# Patient Record
Sex: Female | Born: 1985 | Race: Black or African American | Hispanic: No | Marital: Single | Smoking: Current some day smoker
Health system: Southern US, Community
[De-identification: ages and names within clinical notes are randomized; demographics above are authoritative.]

## PROBLEM LIST (undated history)

## (undated) DIAGNOSIS — O00102 Left tubal pregnancy without intrauterine pregnancy: Secondary | ICD-10-CM

## (undated) DIAGNOSIS — K661 Hemoperitoneum: Secondary | ICD-10-CM

---
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
## 2010-01-10 ENCOUNTER — Emergency Department (HOSPITAL_BASED_OUTPATIENT_CLINIC_OR_DEPARTMENT_OTHER)
Admission: EM | Admit: 2010-01-10 | Discharge: 2010-01-10 | Payer: Self-pay | Source: Home / Self Care | Admitting: Emergency Medicine

## 2010-01-24 ENCOUNTER — Inpatient Hospital Stay (HOSPITAL_COMMUNITY)
Admission: AD | Admit: 2010-01-24 | Discharge: 2010-01-24 | Payer: Self-pay | Source: Home / Self Care | Attending: Obstetrics and Gynecology | Admitting: Obstetrics and Gynecology

## 2010-02-15 ENCOUNTER — Ambulatory Visit: Admit: 2010-02-15 | Payer: Self-pay | Admitting: Obstetrics & Gynecology

## 2010-02-15 ENCOUNTER — Encounter: Payer: Self-pay | Admitting: Obstetrics and Gynecology

## 2010-02-15 ENCOUNTER — Other Ambulatory Visit: Payer: Self-pay | Admitting: Obstetrics and Gynecology

## 2010-02-15 DIAGNOSIS — N839 Noninflammatory disorder of ovary, fallopian tube and broad ligament, unspecified: Secondary | ICD-10-CM

## 2010-02-15 DIAGNOSIS — N83209 Unspecified ovarian cyst, unspecified side: Secondary | ICD-10-CM

## 2010-02-15 LAB — POCT PREGNANCY, URINE: Preg Test, Ur: NEGATIVE

## 2010-02-20 ENCOUNTER — Ambulatory Visit (HOSPITAL_COMMUNITY)
Admission: RE | Admit: 2010-02-20 | Discharge: 2010-02-20 | Disposition: A | Discharge status: Home / Self Care | Payer: Medicaid Other | Source: Ambulatory Visit | Attending: Obstetrics and Gynecology | Admitting: Obstetrics and Gynecology

## 2010-02-20 DIAGNOSIS — N83209 Unspecified ovarian cyst, unspecified side: Secondary | ICD-10-CM

## 2010-03-05 ENCOUNTER — Ambulatory Visit: Payer: Medicaid Other | Admitting: Obstetrics and Gynecology

## 2010-03-05 DIAGNOSIS — N83209 Unspecified ovarian cyst, unspecified side: Secondary | ICD-10-CM

## 2010-04-06 NOTE — Progress Notes (Signed)
Ebony Haney, Ebony Haney                ACCOUNT NO.:  [id number]  MEDICAL RECORD NO.:  [id number]           PATIENT TYPE:  A  LOCATION:  [facility]                   FACILITY:  [facility]  PHYSICIAN:  Catalina Antigua, MD     DATE OF BIRTH:  Jul 08, 1985  DATE OF SERVICE:  02/15/2010                                 CLINIC NOTE  This is a 25 year old, G2, P2, with LMP of December 18, 2009, who presents as an MAU followup for evaluation of right ovarian cyst.  The patient reports a longstanding history of abnormal Pap smear for which she is unclear how abnormal it was, but never really had followup for and for that purpose, she presented at [facility] in mid December for further evaluation.  At [facility], an ultrasound was performed which demonstrated the presence of a complex right adnexal mass which was suggestive for either hemorrhagic cyst, endometrioma or atypical neoplasm.  The patient never seek OB/GYN followup for and presents today for further evaluation.  The patient is currently without any complaint, denies any abnormal bleeding or pelvic pain.  The patient states that she normally gets normal periods, however, did not get a period in January.  She is not currently using any forms of birth control, states that she was previously on Depo-Provera for the past 7 years which was discontinued approximately 2 years ago and she is uncertain why.  PAST MEDICAL HISTORY:  She denies.  PAST SURGICAL HISTORY:  Denies.  OBSTETRICAL HISTORY:  She had 2 full-term vaginal deliveries.  PAST GYNECOLOGIC HISTORY:  She denies any fibroids.  She reports the presence of this ovarian cyst for the past year or so and she reports history of abnormal Pap smear with no followup.  SOCIAL HISTORY:  She denies drinking or the use of illicit drug.  She is a current smoker of 3-4 cigarettes a day for the past 4 years.  FAMILY HISTORY:  Significant for coronary artery disease, hypertension and colon  cancer.  REVIEW OF SYSTEMS:  Otherwise within normal limits.  ALLERGIES:  She denies any allergies to medications or latex.  PHYSICAL EXAMINATION:  VITAL SIGNS:  Her blood pressure is 117/69, pulse of 68, weight of 72.3 kg. ABDOMEN:  Soft, nontender, nondistended. PELVIC:  She had small anteverted uterus.  No palpable adnexal masses or tenderness.  ASSESSMENT AND PLAN:  This is a 25 year old, gravida 2, para 2 who presents today for further evaluation of the right ovarian cyst that was covered at [facility].  Pelvic ultrasound was ordered today. The patient is to follow up in 1 or 2 weeks for discussion of the results and further management.  The patient was informed that if indeed this ovarian mass has been persistent since December that perhaps surgical intervention needs to be undertaken for her history of longstanding abnormal Pap smear with unclear followup.  Records will be obtained from her [facility] GYN office and this will be addressed also at her next visit.  A urine pregnancy test was ordered today, since she has not had a period in January.          ______________________________  Catalina Antigua, MD    PC/MEDQ  D:  02/15/2010  T:  02/16/2010  Job:  [id number]

## 2010-11-07 ENCOUNTER — Emergency Department (INDEPENDENT_AMBULATORY_CARE_PROVIDER_SITE_OTHER): Payer: Medicaid Other

## 2010-11-07 ENCOUNTER — Encounter: Payer: Self-pay | Admitting: *Deleted

## 2010-11-07 ENCOUNTER — Emergency Department (HOSPITAL_BASED_OUTPATIENT_CLINIC_OR_DEPARTMENT_OTHER)
Admission: EM | Admit: 2010-11-07 | Discharge: 2010-11-07 | Disposition: A | Discharge status: Home / Self Care | Payer: Medicaid Other | Attending: Emergency Medicine | Admitting: Emergency Medicine

## 2010-11-07 DIAGNOSIS — R109 Unspecified abdominal pain: Secondary | ICD-10-CM

## 2010-11-07 DIAGNOSIS — N760 Acute vaginitis: Secondary | ICD-10-CM | POA: Insufficient documentation

## 2010-11-07 DIAGNOSIS — O2390 Unspecified genitourinary tract infection in pregnancy, unspecified trimester: Secondary | ICD-10-CM | POA: Insufficient documentation

## 2010-11-07 DIAGNOSIS — A499 Bacterial infection, unspecified: Secondary | ICD-10-CM | POA: Insufficient documentation

## 2010-11-07 DIAGNOSIS — B9689 Other specified bacterial agents as the cause of diseases classified elsewhere: Secondary | ICD-10-CM | POA: Insufficient documentation

## 2010-11-07 DIAGNOSIS — F17200 Nicotine dependence, unspecified, uncomplicated: Secondary | ICD-10-CM | POA: Insufficient documentation

## 2010-11-07 DIAGNOSIS — Z3490 Encounter for supervision of normal pregnancy, unspecified, unspecified trimester: Secondary | ICD-10-CM

## 2010-11-07 LAB — BASIC METABOLIC PANEL
CO2: 27 mEq/L (ref 19–32)
Calcium: 9.4 mg/dL (ref 8.4–10.5)
Potassium: 3.5 mEq/L (ref 3.5–5.1)
Sodium: 136 mEq/L (ref 135–145)

## 2010-11-07 LAB — WET PREP, GENITAL

## 2010-11-07 LAB — DIFFERENTIAL
Basophils Absolute: 0 10*3/uL (ref 0.0–0.1)
Eosinophils Relative: 1 % (ref 0–5)
Lymphocytes Relative: 31 % (ref 12–46)
Neutro Abs: 3.9 10*3/uL (ref 1.7–7.7)
Neutrophils Relative %: 60 % (ref 43–77)

## 2010-11-07 LAB — URINE MICROSCOPIC-ADD ON

## 2010-11-07 LAB — URINALYSIS, ROUTINE W REFLEX MICROSCOPIC
Glucose, UA: NEGATIVE mg/dL
Protein, ur: NEGATIVE mg/dL
Urobilinogen, UA: 0.2 mg/dL (ref 0.0–1.0)

## 2010-11-07 LAB — CBC
MCV: 86.6 fL (ref 78.0–100.0)
Platelets: 202 10*3/uL (ref 150–400)
RDW: 12.7 % (ref 11.5–15.5)
WBC: 6.5 10*3/uL (ref 4.0–10.5)

## 2010-11-07 LAB — HCG, QUANTITATIVE, PREGNANCY: hCG, Beta Chain, Quant, S: 24676 m[IU]/mL — ABNORMAL HIGH (ref <5)

## 2010-11-07 MED ORDER — METRONIDAZOLE 500 MG PO TABS: 500.0000 mg | ORAL_TABLET | Freq: Two times a day (BID) | ORAL | Status: AC

## 2010-11-07 NOTE — ED Notes (Signed)
Patient states she developed pain in her lower abdomin yesterday and now pain in the upper middle abdomin.  Patient states she is approximately [redacted] weeks pregnant, gravida 3 para 2.

## 2010-11-07 NOTE — ED Provider Notes (Signed)
History     CSN: [id number] Arrival date & time: 11/07/2010 12:29 PM   First MD Initiated Contact with Patient 11/07/10 1230      Chief Complaint  Patient presents with  . Abdominal Pain    [redacted] weeks pregnant    (Consider location/radiation/quality/duration/timing/severity/associated sxs/prior treatment) HPI Comments: Patient says that she has developed sharp pains in her lower abdomen starting yesterday. She is pregnant, with her last menstrual period on 07/07/2010. She has had a positive pregnancy test but has not seen the obstetrician yet. She has an appointment with Dr. Alyce Pagan on November 6. This is her third pregnancy. She had normal spontaneous delivery of her other 2 children. There has been no vomiting or diarrhea. She denies dysuria. She denies vaginal discharge. There has been no vaginal bleeding.  Patient is a 25 y.o. female presenting with abdominal pain. The history is provided by the patient. No language interpreter was used.  Abdominal Pain The primary symptoms of the illness include abdominal pain. The primary symptoms of the illness do not include fever, nausea, vomiting, diarrhea, dysuria, vaginal discharge or vaginal bleeding. The current episode started yesterday. The onset of the illness was gradual. The problem has not changed since onset. The patient states that she believes she is currently pregnant. The patient has not had a change in bowel habit.    Past Medical History  Diagnosis Date  . Hypertension in pregnancy     during second pregnancy    History reviewed. No pertinent past surgical history.  No family history on file.  History  Substance Use Topics  . Smoking status: Current Some Day Smoker -- 0.5 packs/day    Types: Cigarettes  . Smokeless tobacco: Not on file  . Alcohol Use: No    OB History    Grav Para Term Preterm Abortions TAB SAB Ect Mult Living   3 2              Review of Systems  Constitutional: Negative.  Negative for  fever.  HENT: Negative.   Eyes: Negative.   Respiratory: Negative.   Gastrointestinal: Positive for abdominal pain and abdominal distention. Negative for nausea, vomiting and diarrhea.  Genitourinary: Negative for dysuria, vaginal bleeding and vaginal discharge.  Musculoskeletal: Negative.   Skin: Negative.   Neurological: Negative.   Psychiatric/Behavioral: Negative.     Allergies  Review of patient's allergies indicates no known allergies.  Home Medications   Current Outpatient Rx  Name Route Sig Dispense Refill  . PRENATAL #2 PO Oral Take by mouth.      . METRONIDAZOLE 500 MG PO TABS Oral Take 1 tablet (500 mg total) by mouth 2 (two) times daily. 14 tablet 0    BP 123/65  Pulse 85  Temp(Src) 98.8 F (37.1 C) (Oral)  Resp 20  Ht 5\' 7"  (1.702 m)  Wt 154 lb (69.854 kg)  BMI 24.12 kg/m2  SpO2 100%  LMP 08/07/2010  Physical Exam  Constitutional: She is oriented to person, place, and time. She appears well-developed and well-nourished. No distress.  HENT:  Head: Normocephalic and atraumatic.  Right Ear: External ear normal.  Left Ear: External ear normal.  Mouth/Throat: Oropharynx is clear and moist.       She has a tongue piercing.  Eyes: Conjunctivae and EOM are normal. Pupils are equal, round, and reactive to light.  Neck: Normal range of motion. Neck supple. No thyromegaly present.  Cardiovascular: Normal rate, regular rhythm and normal heart sounds.   Pulmonary/Chest: Effort  normal and breath sounds normal.  Abdominal: Soft. Bowel sounds are normal. She exhibits no distension. There is no tenderness.  Musculoskeletal: Normal range of motion. She exhibits no edema.  Lymphadenopathy:    She has no cervical adenopathy.  Neurological: She is alert and oriented to person, place, and time. She has normal reflexes.       No sensory or motor deficits.  Skin: Skin is warm and dry.  Psychiatric: She has a normal mood and affect. Her behavior is normal.    ED Course    Procedures (including critical care time)  Labs Reviewed  URINALYSIS, ROUTINE W REFLEX MICROSCOPIC - Abnormal; Notable for the following:    Appearance CLOUDY (*)    Leukocytes, UA TRACE (*)    All other components within normal limits  HCG, QUANTITATIVE, PREGNANCY - Abnormal; Notable for the following:    hCG, Beta Chain, Quant, [place] [address] (*)    All other components within normal limits  WET PREP, GENITAL - Abnormal; Notable for the following:    Clue Cells, Wet Prep TOO NUMEROUS TO COUNT (*)    WBC, Wet Prep HPF POC FEW (*)    All other components within normal limits  URINE MICROSCOPIC-ADD ON - Abnormal; Notable for the following:    Squamous Epithelial / LPF FEW (*)    Bacteria, UA MANY (*)    All other components within normal limits  CBC  DIFFERENTIAL  BASIC METABOLIC PANEL  GC/CHLAMYDIA PROBE AMP, GENITAL   3:03 PM Pt was seen and had physical examination. Lab tests and pelvic ultrasound were ordered.  3:03 PM Results for orders placed during the hospital encounter of 11/07/10  CBC      Component Value Range   WBC 6.5  4.0 - 10.5 (K/uL)   RBC 4.18  3.87 - 5.11 (MIL/uL)   Hemoglobin 12.0  12.0 - 15.0 (g/dL)   HCT 60.4  54.0 - 98.1 (%)   MCV 86.6  78.0 - 100.0 (fL)   MCH 28.7  26.0 - 34.0 (pg)   MCHC 33.1  30.0 - 36.0 (g/dL)   RDW 19.1  47.8 - 29.5 (%)   Platelets 202  150 - 400 (K/uL)  DIFFERENTIAL      Component Value Range   Neutrophils Relative 60  43 - 77 (%)   Neutro Abs 3.9  1.7 - 7.7 (K/uL)   Lymphocytes Relative 31  12 - 46 (%)   Lymphs Abs 2.0  0.7 - 4.0 (K/uL)   Monocytes Relative 9  3 - 12 (%)   Monocytes Absolute 0.6  0.1 - 1.0 (K/uL)   Eosinophils Relative 1  0 - 5 (%)   Eosinophils Absolute 0.0  0.0 - 0.7 (K/uL)   Basophils Relative 0  0 - 1 (%)   Basophils Absolute 0.0  0.0 - 0.1 (K/uL)  BASIC METABOLIC PANEL      Component Value Range   Sodium 136  135 - 145 (mEq/L)   Potassium 3.5  3.5 - 5.1 (mEq/L)   Chloride 101  96 - 112 (mEq/L)   CO2  27  19 - 32 (mEq/L)   Glucose, Bld 94  70 - 99 (mg/dL)   BUN 8  6 - 23 (mg/dL)   Creatinine, Ser 6.21  0.50 - 1.10 (mg/dL)   Calcium 9.4  8.4 - 30.8 (mg/dL)   GFR calc non Af Amer >90  >90 (mL/min)   GFR calc Af Amer >90  >90 (mL/min)  URINALYSIS, ROUTINE W REFLEX  MICROSCOPIC      Component Value Range   Color, Urine YELLOW  YELLOW    Appearance CLOUDY (*) CLEAR    Specific Gravity, Urine 1.024  1.005 - 1.030    pH 8.0  5.0 - 8.0    Glucose, UA NEGATIVE  NEGATIVE (mg/dL)   Hgb urine dipstick NEGATIVE  NEGATIVE    Bilirubin Urine NEGATIVE  NEGATIVE    Ketones, ur NEGATIVE  NEGATIVE (mg/dL)   Protein, ur NEGATIVE  NEGATIVE (mg/dL)   Urobilinogen, UA 0.2  0.0 - 1.0 (mg/dL)   Nitrite NEGATIVE  NEGATIVE    Leukocytes, UA TRACE (*) NEGATIVE   HCG, QUANTITATIVE, PREGNANCY      Component Value Range   hCG, Beta Chain, Quant, [place] [address] (*) <5 (mIU/mL)  WET PREP, GENITAL      Component Value Range   Yeast, Wet Prep NONE SEEN  NONE SEEN    Trich, Wet Prep NONE SEEN  NONE SEEN    Clue Cells, Wet Prep TOO NUMEROUS TO COUNT (*) NONE SEEN    WBC, Wet Prep HPF POC FEW (*) NONE SEEN   URINE MICROSCOPIC-ADD ON      Component Value Range   Squamous Epithelial / LPF FEW (*) RARE    WBC, UA 3-6  <3 (WBC/hpf)   RBC / HPF 0-2  <3 (RBC/hpf)   Bacteria, UA MANY (*) RARE    US Ob Comp Less 14 Wks  11/07/2010  *RADIOLOGY REPORT*  Clinical Data: Lambert Mody lower abdominal pain.  Pregnant.  OBSTETRIC <14 WK ULTRASOUND  Technique:  Transabdominal ultrasound was performed for evaluation of the gestation as well as the maternal uterus and adnexal regions.  Comparison:  Cold ultrasound 02/20/2010.  Intrauterine gestational sac: Single.  Normal in shape. Yolk sac: No longer visible. Embryo: Present. Cardiac Activity: Present. Heart Rate: 155 bpm  CRL:  65.1 mm  12w  6d        [place] EDC: 05/16/2011  Maternal uterus/Adnexae: Uterus and adnexa are normal.  IMPRESSION: Single intrauterine pregnancy with an estimated  gestational age of [redacted] weeks 6 days.  No complicating features are evident.  Original Report Authenticated By: Jamesetta Orleans. MATTERN, M.D.   Lab tests show bacterial vaginosis.  Ultrasound shows an intrauterine pregnancy 12 weeks and 6 days.  Rx with metronidazole 500 mg bid x 7 days.    1. Normal intrauterine pregnancy on prenatal ultrasound   2. Bacterial vaginosis              Carleene Cooper III, MD 11/07/10 [phone]

## 2010-12-27 ENCOUNTER — Encounter (HOSPITAL_BASED_OUTPATIENT_CLINIC_OR_DEPARTMENT_OTHER): Payer: Self-pay

## 2010-12-27 ENCOUNTER — Emergency Department (HOSPITAL_BASED_OUTPATIENT_CLINIC_OR_DEPARTMENT_OTHER)
Admission: EM | Admit: 2010-12-27 | Discharge: 2010-12-27 | Disposition: A | Discharge status: Home / Self Care | Payer: Medicaid Other | Attending: Emergency Medicine | Admitting: Emergency Medicine

## 2010-12-27 DIAGNOSIS — A499 Bacterial infection, unspecified: Secondary | ICD-10-CM | POA: Insufficient documentation

## 2010-12-27 DIAGNOSIS — N760 Acute vaginitis: Secondary | ICD-10-CM | POA: Insufficient documentation

## 2010-12-27 DIAGNOSIS — Z331 Pregnant state, incidental: Secondary | ICD-10-CM

## 2010-12-27 DIAGNOSIS — R109 Unspecified abdominal pain: Secondary | ICD-10-CM | POA: Insufficient documentation

## 2010-12-27 DIAGNOSIS — F17200 Nicotine dependence, unspecified, uncomplicated: Secondary | ICD-10-CM | POA: Insufficient documentation

## 2010-12-27 DIAGNOSIS — O2390 Unspecified genitourinary tract infection in pregnancy, unspecified trimester: Secondary | ICD-10-CM | POA: Insufficient documentation

## 2010-12-27 DIAGNOSIS — B9689 Other specified bacterial agents as the cause of diseases classified elsewhere: Secondary | ICD-10-CM | POA: Insufficient documentation

## 2010-12-27 LAB — URINALYSIS, ROUTINE W REFLEX MICROSCOPIC
Ketones, ur: NEGATIVE mg/dL
Leukocytes, UA: NEGATIVE
Nitrite: NEGATIVE
Protein, ur: NEGATIVE mg/dL

## 2010-12-27 MED ORDER — METRONIDAZOLE 500 MG PO TABS: 500.0000 mg | ORAL_TABLET | Freq: Two times a day (BID) | ORAL | Status: AC

## 2010-12-27 NOTE — ED Notes (Signed)
Pelvic cart ready. 

## 2010-12-27 NOTE — ED Notes (Signed)
Pt reports suprapubic pain described as pressure that started 4 hours PTA.

## 2010-12-27 NOTE — ED Notes (Signed)
Pt reports she was seen at [facility] yesterday and had a normal ultrasound.

## 2010-12-27 NOTE — ED Provider Notes (Signed)
History     CSN: [id number] Arrival date & time: 12/27/2010 10:28 AM   First MD Initiated Contact with Patient 12/27/10 1057      Chief Complaint  Patient presents with  . Abdominal Pain     HPI  G3P2 [redacted] weeks pregnant pw suprapubic pressure. States pressure began 4 hour pta. Only present when sitting. Denies back pain, cramping, abdominal pain, nausea, vomiting. Denies fever, chills. Denies hematuria/dysuria/freq/urgency. No rash. Denies vaginal bleeding, gush of fluid. Denies itching, vaginal dc. No h/o STI.   ED Notes, ED Provider Notes from 12/27/10 0000 to 12/27/10 10:38:12       Joylene John, RN 12/27/2010 10:31      Pt reports suprapubic pain described as pressure that started 4 hours PTA.     History reviewed. No pertinent past medical history.  History reviewed. No pertinent past surgical history.  No family history on file.  History  Substance Use Topics  . Smoking status: Current Some Day Smoker -- 0.5 packs/day    Types: Cigarettes  . Smokeless tobacco: Not on file  . Alcohol Use: No    OB History    Grav Para Term Preterm Abortions TAB SAB Ect Mult Living   4 2              Review of Systems  All other systems reviewed and are negative.   except as noted HPI   Allergies  Review of patient's allergies indicates no known allergies.  Home Medications   Current Outpatient Rx  Name Route Sig Dispense Refill  . AMOXICILLIN 500 MG PO CAPS Oral Take 500 mg by mouth 3 (three) times daily.      Marland Kitchen METRONIDAZOLE 500 MG PO TABS Oral Take 1 tablet (500 mg total) by mouth 2 (two) times daily. 14 tablet 0  . PRENATAL #2 PO Oral Take by mouth.        BP 123/60  Pulse 81  Temp(Src) 98.2 F (36.8 C) (Oral)  Resp 16  Ht 5\' 6"  (1.676 m)  Wt 170 lb (77.111 kg)  BMI 27.44 kg/m2  SpO2 99%  LMP 08/07/2010  Physical Exam  Nursing note and vitals reviewed. Constitutional: She is oriented to person, place, and time. She appears well-developed.  HENT:   Head: Atraumatic.  Mouth/Throat: Oropharynx is clear and moist.  Eyes: Conjunctivae and EOM are normal. Pupils are equal, round, and reactive to light.  Neck: Normal range of motion. Neck supple.  Cardiovascular: Normal rate, regular rhythm, normal heart sounds and intact distal pulses.   Pulmonary/Chest: Effort normal and breath sounds normal. No respiratory distress. She has no wheezes. She has no rales.  Abdominal: Soft. She exhibits no distension. There is no tenderness. There is no rebound and no guarding.       Gravid uterus  Genitourinary:       Min white discharge No cmt Os closed No blood in vault  Musculoskeletal: Normal range of motion.  Neurological: She is alert and oriented to person, place, and time.  Skin: Skin is warm and dry. No rash noted.  Psychiatric: She has a normal mood and affect.    ED Course  Procedures (including critical care time)  Labs Reviewed  WET PREP, GENITAL - Abnormal; Notable for the following:    Yeast, Wet Prep FEW (*)    Clue Cells, Wet Prep MODERATE (*)    WBC, Wet Prep HPF POC MODERATE (*)    All other components within normal limits  URINALYSIS,  ROUTINE W REFLEX MICROSCOPIC  GC/CHLAMYDIA PROBE AMP, GENITAL   No results found.   1. Pregnancy as incidental finding   2. Bacterial vaginosis     MDM  Appears well. FHR 132. Pressure "positional" and none present at this time. Min discharge on exam but no itching. Gave pt prescription for flagyl for worsening discharge, pruritis. F/U with her OB. Comfortable with plan for discharge        Forbes Cellar, MD 12/27/10 1245

## 2012-06-12 IMAGING — US US OB COMP LESS 14 WK
1 series · 14 of 28 positions shown · non-contrast
Comparison: Cold ultrasound 02/20/2010.

CLINICAL DATA: Sharp lower abdominal pain.  Pregnant.

OBSTETRIC <14 WK ULTRASOUND
TECHNIQUE: Transabdominal ultrasound was performed for evaluation
of the gestation as well as the maternal uterus and adnexal
regions.

[Series 1: us ob comp less 14 wk · 0.26mm/px · 14 of 34 slices shown]
[im 2/34]
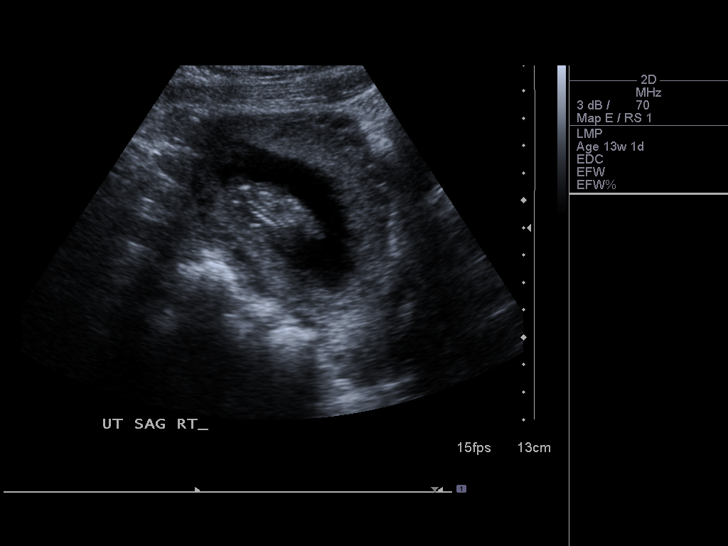
[im 4/34]
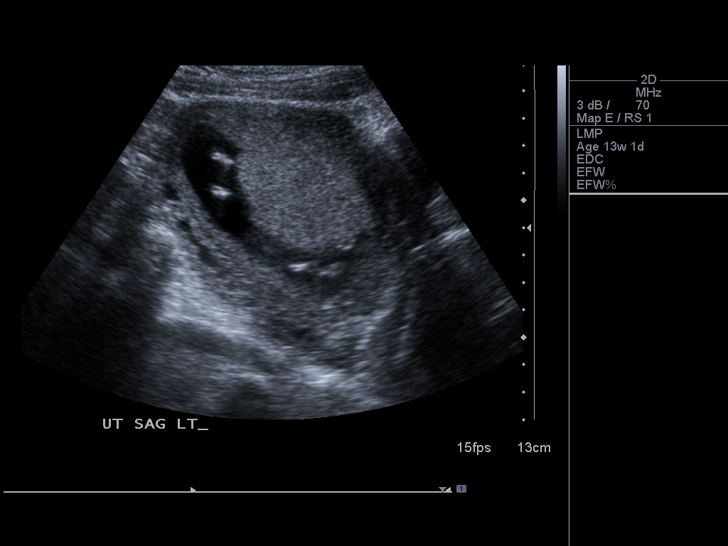
[im 7/34]
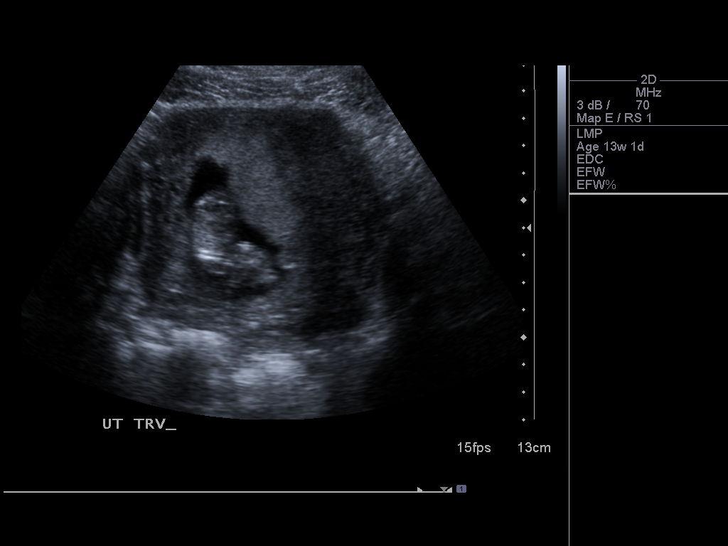
[im 9/34]
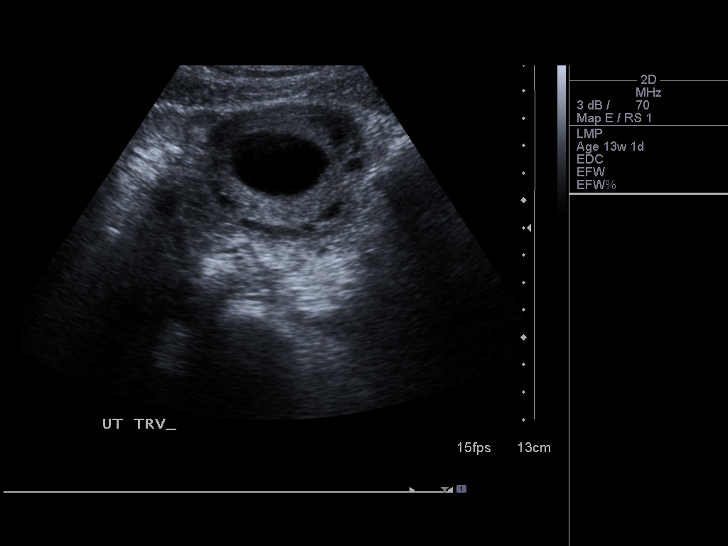
[im 12/34]
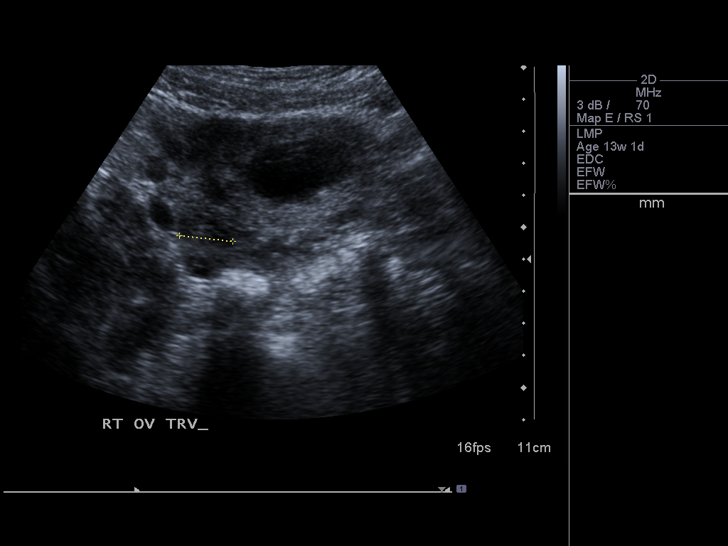
[im 14/34]
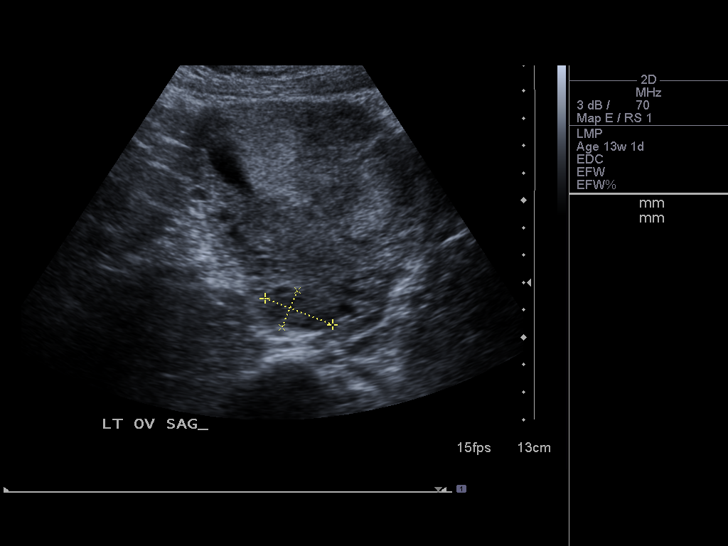
[im 16/34]
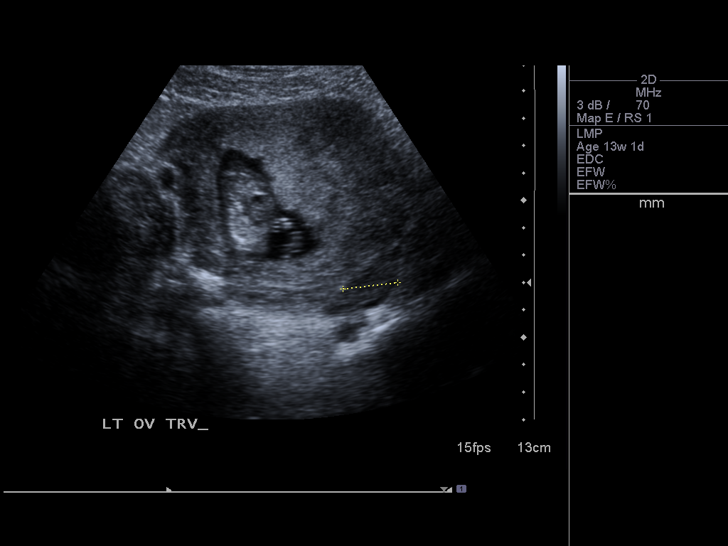
[im 19/34]
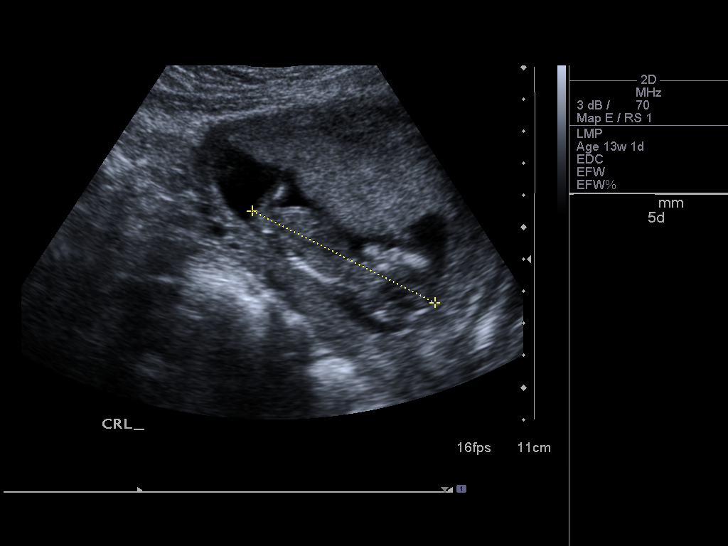
[im 21/34]
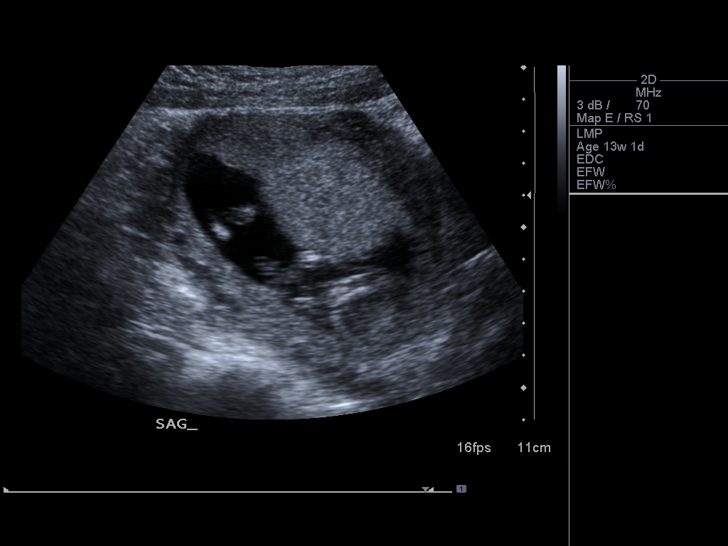
[im 24/34]
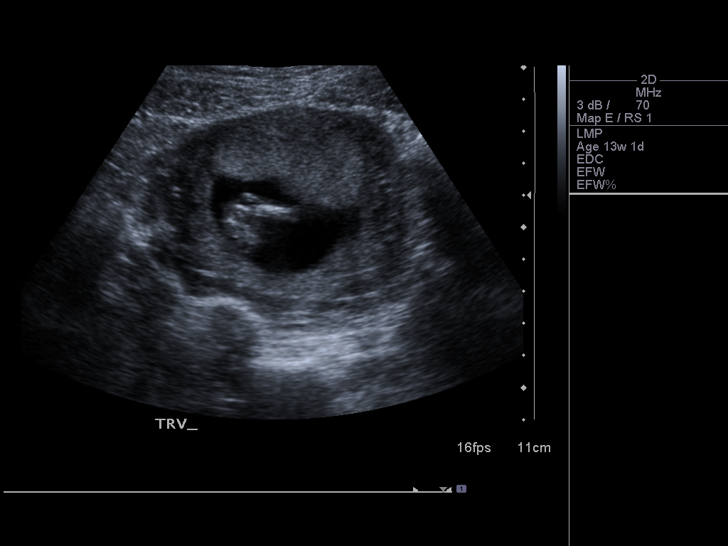
[im 26/34]
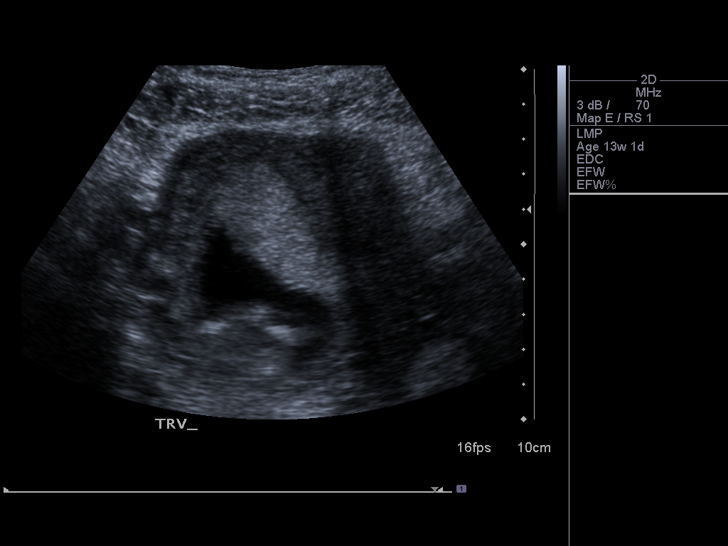
[im 29/34]
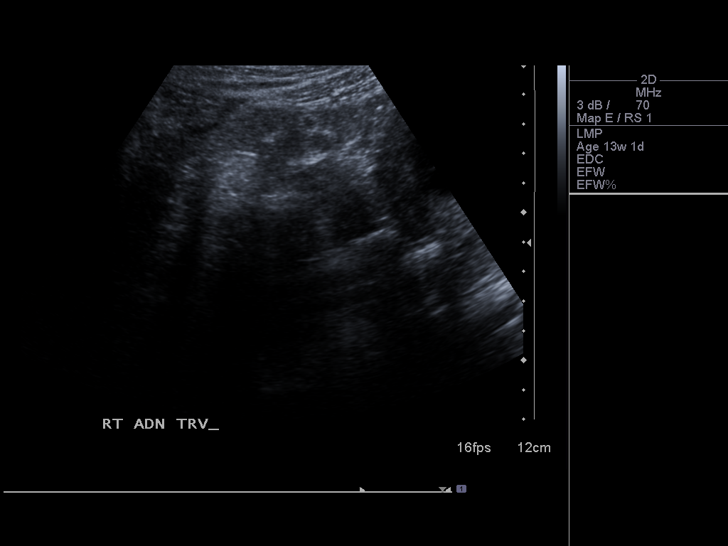
[im 31/34]
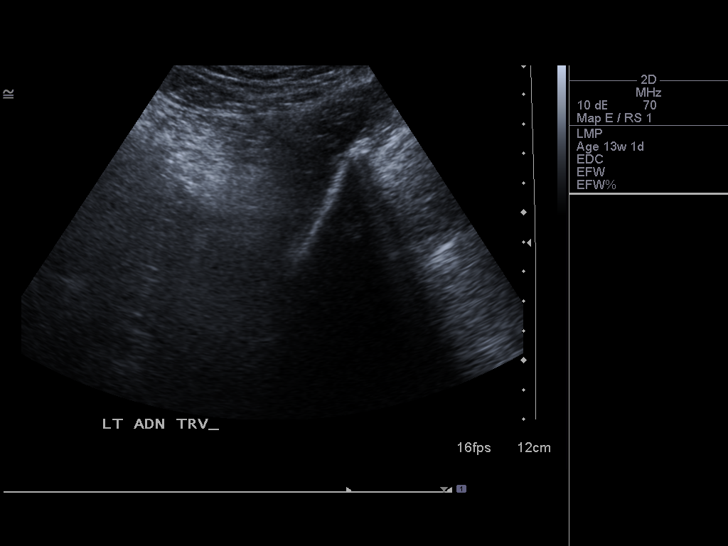
[im 34/34]
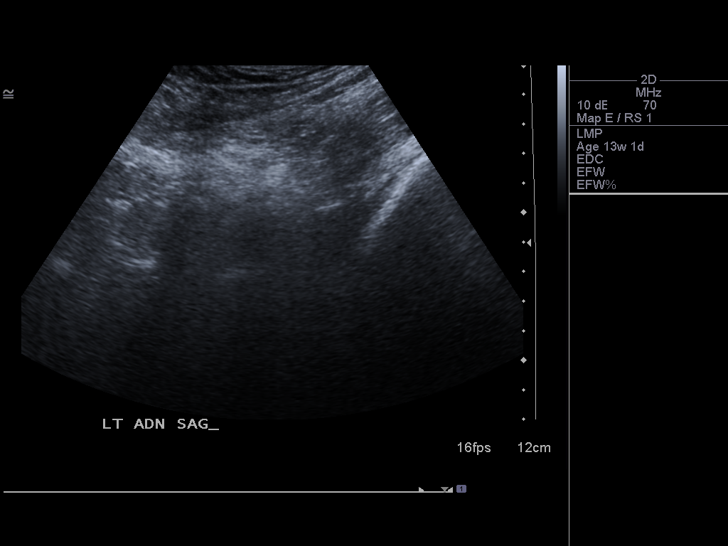

[14 of 28 positions shown; findings below may reference images not displayed]

Intrauterine gestational sac: Single.  Normal in shape.
Yolk sac: No longer visible.
Embryo: Present.
Cardiac Activity: Present.
Heart Rate: 155 bpm

CRL:  65.1 mm  12w  6d        US EDC: 05/16/2011

Maternal uterus/Adnexae:
Uterus and adnexa are normal.
IMPRESSION: Single intrauterine pregnancy with an estimated gestational age of
12 weeks 6 days.  No complicating features are evident.

## 2012-11-14 ENCOUNTER — Emergency Department (HOSPITAL_BASED_OUTPATIENT_CLINIC_OR_DEPARTMENT_OTHER)
Admission: EM | Admit: 2012-11-14 | Discharge: 2012-11-14 | Disposition: A | Discharge status: Home / Self Care | Payer: Medicaid Other | Attending: Emergency Medicine | Admitting: Emergency Medicine

## 2012-11-14 ENCOUNTER — Encounter (HOSPITAL_BASED_OUTPATIENT_CLINIC_OR_DEPARTMENT_OTHER): Payer: Self-pay | Admitting: Emergency Medicine

## 2012-11-14 DIAGNOSIS — J029 Acute pharyngitis, unspecified: Secondary | ICD-10-CM | POA: Insufficient documentation

## 2012-11-14 DIAGNOSIS — R0609 Other forms of dyspnea: Secondary | ICD-10-CM | POA: Insufficient documentation

## 2012-11-14 DIAGNOSIS — O2390 Unspecified genitourinary tract infection in pregnancy, unspecified trimester: Secondary | ICD-10-CM | POA: Insufficient documentation

## 2012-11-14 DIAGNOSIS — R0989 Other specified symptoms and signs involving the circulatory and respiratory systems: Secondary | ICD-10-CM | POA: Insufficient documentation

## 2012-11-14 DIAGNOSIS — F17200 Nicotine dependence, unspecified, uncomplicated: Secondary | ICD-10-CM | POA: Insufficient documentation

## 2012-11-14 DIAGNOSIS — N390 Urinary tract infection, site not specified: Secondary | ICD-10-CM | POA: Insufficient documentation

## 2012-11-14 DIAGNOSIS — Z3490 Encounter for supervision of normal pregnancy, unspecified, unspecified trimester: Secondary | ICD-10-CM

## 2012-11-14 LAB — URINALYSIS, ROUTINE W REFLEX MICROSCOPIC
Glucose, UA: NEGATIVE mg/dL
Protein, ur: NEGATIVE mg/dL
pH: 7 (ref 5.0–8.0)

## 2012-11-14 LAB — WET PREP, GENITAL: Clue Cells Wet Prep HPF POC: NONE SEEN

## 2012-11-14 LAB — PREGNANCY, URINE: Preg Test, Ur: POSITIVE — AB

## 2012-11-14 LAB — URINE MICROSCOPIC-ADD ON

## 2012-11-14 MED ORDER — PRENATAL COMPLETE 14-0.4 MG PO TABS: 1.0000 | ORAL_TABLET | Freq: Once | ORAL | Status: DC

## 2012-11-14 MED ORDER — AMOXICILLIN 500 MG PO CAPS: 500.0000 mg | ORAL_CAPSULE | Freq: Three times a day (TID) | ORAL | Status: DC

## 2012-11-14 NOTE — ED Provider Notes (Signed)
CSN: [id number]     Arrival date & time 11/14/12  1954 History   First MD Initiated Contact with Patient 11/14/12 2045     Chief Complaint  Patient presents with  . Sore Throat  . Vaginal Discharge   (Consider location/radiation/quality/duration/timing/severity/associated sxs/prior Treatment) Patient is a 27 y.o. female presenting with pharyngitis and vaginal discharge. The history is provided by the patient. No language interpreter was used.  Sore Throat This is a new problem. The current episode started today. The problem occurs constantly. The problem has been unchanged. Nothing aggravates the symptoms. She has tried nothing for the symptoms. The treatment provided no relief.  Vaginal Discharge Quality:  Watery Severity:  Mild Onset quality:  Sudden Timing:  Constant Progression:  Worsening Ineffective treatments:  None tried   History reviewed. No pertinent past medical history. History reviewed. No pertinent past surgical history. History reviewed. No pertinent family history. History  Substance Use Topics  . Smoking status: Current Some Day Smoker -- 0.50 packs/day    Types: Cigarettes  . Smokeless tobacco: Not on file  . Alcohol Use: No   OB History   Grav Para Term Preterm Abortions TAB SAB Ect Mult Living   4 2             Review of Systems  Genitourinary: Positive for vaginal discharge.  All other systems reviewed and are negative.    Allergies  Review of patient's allergies indicates no known allergies.  Home Medications   Current Outpatient Rx  Name  Route  Sig  Dispense  Refill  . amoxicillin (AMOXIL) 500 MG capsule   Oral   Take 500 mg by mouth 3 (three) times daily.           . Prenatal Multivit-Min-Fe-FA (PRENATAL #2 PO)   Oral   Take by mouth.            BP 143/76  Pulse 78  Temp(Src) 98.3 F (36.8 C) (Oral)  Resp 18  Ht 5\' 7"  (1.702 m)  Wt 170 lb (77.111 kg)  BMI 26.62 kg/m2  SpO2 100%  LMP 07/01/2012  Breastfeeding?  Unknown Physical Exam  Nursing note and vitals reviewed. Constitutional: She is oriented to person, place, and time. She appears well-developed and well-nourished.  HENT:  Head: Normocephalic.  Right Ear: External ear normal.  Left Ear: External ear normal.  Mouth/Throat: Oropharynx is clear and moist.  Eyes: Conjunctivae and EOM are normal. Pupils are equal, round, and reactive to light.  Neck: Normal range of motion.  Cardiovascular: Normal rate and regular rhythm.   Pulmonary/Chest: She is in respiratory distress.  Abdominal: Soft. She exhibits no distension.  Genitourinary: Vagina normal and uterus normal.  Musculoskeletal: Normal range of motion.  Neurological: She is alert and oriented to person, place, and time.  Skin: Skin is warm.  Psychiatric: She has a normal mood and affect.    ED Course  Procedures (including critical care time) Labs Review Labs Reviewed  WET PREP, GENITAL - Abnormal; Notable for the following:    WBC, Wet Prep HPF POC MANY (*)    All other components within normal limits  URINALYSIS, ROUTINE W REFLEX MICROSCOPIC - Abnormal; Notable for the following:    APPearance CLOUDY (*)    Leukocytes, UA MODERATE (*)    All other components within normal limits  PREGNANCY, URINE - Abnormal; Notable for the following:    Preg Test, Ur POSITIVE (*)    All other components within normal limits  URINE  MICROSCOPIC-ADD ON - Abnormal; Notable for the following:    Squamous Epithelial / LPF MANY (*)    All other components within normal limits  GC/CHLAMYDIA PROBE AMP   Imaging Review No results found.  EKG Interpretation   None       MDM   1. Pregnancy   2. UTI (lower urinary tract infection)    Pregnancy test is positive,   I will treat uti with amoxicillian.   Pt given rx for prenatal vitamins    Elson Areas, PA-C 11/14/12 2203

## 2012-11-14 NOTE — ED Notes (Signed)
Pt c/o sore throat x1 day, states it is hard to swallow - also c/o foul smell from vaginal area x3 days.

## 2012-11-15 NOTE — ED Provider Notes (Signed)
Medical screening examination/treatment/procedure(s) were performed by non-physician practitioner and as supervising physician I was immediately available for consultation/collaboration.  EKG Interpretation   None         Junius Argyle, MD 11/15/12 1045

## 2012-11-16 LAB — GC/CHLAMYDIA PROBE AMP: GC Probe RNA: NEGATIVE

## 2013-11-15 ENCOUNTER — Encounter (HOSPITAL_BASED_OUTPATIENT_CLINIC_OR_DEPARTMENT_OTHER): Payer: Self-pay | Admitting: Emergency Medicine

## 2014-10-19 ENCOUNTER — Emergency Department (HOSPITAL_BASED_OUTPATIENT_CLINIC_OR_DEPARTMENT_OTHER)
Admission: EM | Admit: 2014-10-19 | Discharge: 2014-10-20 | Disposition: A | Discharge status: Home / Self Care | Payer: Medicaid Other | Attending: Emergency Medicine | Admitting: Emergency Medicine

## 2014-10-19 ENCOUNTER — Encounter (HOSPITAL_BASED_OUTPATIENT_CLINIC_OR_DEPARTMENT_OTHER): Payer: Self-pay

## 2014-10-19 DIAGNOSIS — Y92410 Unspecified street and highway as the place of occurrence of the external cause: Secondary | ICD-10-CM | POA: Insufficient documentation

## 2014-10-19 DIAGNOSIS — Z792 Long term (current) use of antibiotics: Secondary | ICD-10-CM | POA: Insufficient documentation

## 2014-10-19 DIAGNOSIS — Y9389 Activity, other specified: Secondary | ICD-10-CM | POA: Insufficient documentation

## 2014-10-19 DIAGNOSIS — S39012A Strain of muscle, fascia and tendon of lower back, initial encounter: Secondary | ICD-10-CM | POA: Insufficient documentation

## 2014-10-19 DIAGNOSIS — Z720 Tobacco use: Secondary | ICD-10-CM | POA: Insufficient documentation

## 2014-10-19 DIAGNOSIS — Y998 Other external cause status: Secondary | ICD-10-CM | POA: Insufficient documentation

## 2014-10-19 MED ORDER — CYCLOBENZAPRINE HCL 10 MG PO TABS
5.0000 mg | ORAL_TABLET | Freq: Once | ORAL | Status: AC
  Administered 2014-10-19: 5 mg via ORAL
  Filled 2014-10-19: qty 1

## 2014-10-19 MED ORDER — IBUPROFEN 800 MG PO TABS
800.0000 mg | ORAL_TABLET | Freq: Once | ORAL | Status: AC
  Administered 2014-10-19: 800 mg via ORAL
  Filled 2014-10-19: qty 1
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
## 2014-10-20 MED ORDER — IBUPROFEN 600 MG PO TABS: 600.0000 mg | ORAL_TABLET | Freq: Four times a day (QID) | ORAL | Status: DC | PRN

## 2014-10-20 MED ORDER — CYCLOBENZAPRINE HCL 5 MG PO TABS: 5.0000 mg | ORAL_TABLET | Freq: Three times a day (TID) | ORAL | Status: DC | PRN

## 2014-10-20 NOTE — Discharge Instructions (Signed)

## 2015-03-22 ENCOUNTER — Emergency Department (HOSPITAL_BASED_OUTPATIENT_CLINIC_OR_DEPARTMENT_OTHER): Payer: Self-pay

## 2015-03-22 ENCOUNTER — Encounter (HOSPITAL_BASED_OUTPATIENT_CLINIC_OR_DEPARTMENT_OTHER): Payer: Self-pay | Admitting: Emergency Medicine

## 2015-03-22 ENCOUNTER — Emergency Department (HOSPITAL_BASED_OUTPATIENT_CLINIC_OR_DEPARTMENT_OTHER)
Admission: EM | Admit: 2015-03-22 | Discharge: 2015-03-22 | Disposition: A | Discharge status: Home / Self Care | Payer: Self-pay | Attending: Emergency Medicine | Admitting: Emergency Medicine

## 2015-03-22 DIAGNOSIS — F17210 Nicotine dependence, cigarettes, uncomplicated: Secondary | ICD-10-CM | POA: Insufficient documentation

## 2015-03-22 DIAGNOSIS — Z3490 Encounter for supervision of normal pregnancy, unspecified, unspecified trimester: Secondary | ICD-10-CM

## 2015-03-22 DIAGNOSIS — R1032 Left lower quadrant pain: Secondary | ICD-10-CM

## 2015-03-22 DIAGNOSIS — Z792 Long term (current) use of antibiotics: Secondary | ICD-10-CM | POA: Insufficient documentation

## 2015-03-22 DIAGNOSIS — Z3A00 Weeks of gestation of pregnancy not specified: Secondary | ICD-10-CM | POA: Insufficient documentation

## 2015-03-22 DIAGNOSIS — O99331 Smoking (tobacco) complicating pregnancy, first trimester: Secondary | ICD-10-CM | POA: Insufficient documentation

## 2015-03-22 DIAGNOSIS — O209 Hemorrhage in early pregnancy, unspecified: Secondary | ICD-10-CM | POA: Insufficient documentation

## 2015-03-22 LAB — CBC WITH DIFFERENTIAL/PLATELET
BASOS PCT: 0 %
Basophils Absolute: 0 10*3/uL (ref 0.0–0.1)
EOS ABS: 0 10*3/uL (ref 0.0–0.7)
Eosinophils Relative: 1 %
HEMATOCRIT: 39.9 % (ref 36.0–46.0)
Hemoglobin: 13 g/dL (ref 12.0–15.0)
Lymphocytes Relative: 39 %
Lymphs Abs: 1.5 10*3/uL (ref 0.7–4.0)
MCH: 28.5 pg (ref 26.0–34.0)
MCHC: 32.6 g/dL (ref 30.0–36.0)
MCV: 87.5 fL (ref 78.0–100.0)
MONO ABS: 0.3 10*3/uL (ref 0.1–1.0)
MONOS PCT: 7 %
Neutro Abs: 2 10*3/uL (ref 1.7–7.7)
Neutrophils Relative %: 53 %
Platelets: 235 10*3/uL (ref 150–400)
RBC: 4.56 MIL/uL (ref 3.87–5.11)
RDW: 12.5 % (ref 11.5–15.5)
WBC: 3.8 10*3/uL — ABNORMAL LOW (ref 4.0–10.5)

## 2015-03-22 LAB — BASIC METABOLIC PANEL
Anion gap: 10 (ref 5–15)
BUN: 13 mg/dL (ref 6–20)
CO2: 23 mmol/L (ref 22–32)
CREATININE: 0.75 mg/dL (ref 0.44–1.00)
Calcium: 9.1 mg/dL (ref 8.9–10.3)
Chloride: 104 mmol/L (ref 101–111)
GFR calc non Af Amer: >60 mL/min (ref >60)
Glucose, Bld: 99 mg/dL (ref 65–99)
Potassium: 3.9 mmol/L (ref 3.5–5.1)
Sodium: 137 mmol/L (ref 135–145)

## 2015-03-22 LAB — PREGNANCY, URINE: Preg Test, Ur: POSITIVE — AB

## 2015-03-22 LAB — URINALYSIS, ROUTINE W REFLEX MICROSCOPIC
BILIRUBIN URINE: NEGATIVE
GLUCOSE, UA: NEGATIVE mg/dL
Ketones, ur: 15 mg/dL — AB
Nitrite: NEGATIVE
PROTEIN: NEGATIVE mg/dL
Specific Gravity, Urine: 1.026 (ref 1.005–1.030)
pH: 6.5 (ref 5.0–8.0)

## 2015-03-22 LAB — WET PREP, GENITAL
SPERM: NONE SEEN
TRICH WET PREP: NONE SEEN
YEAST WET PREP: NONE SEEN

## 2015-03-22 LAB — URINE MICROSCOPIC-ADD ON

## 2015-03-22 LAB — HCG, QUANTITATIVE, PREGNANCY: HCG, BETA CHAIN, QUANT, S: 575 m[IU]/mL — AB (ref <5)

## 2015-03-22 LAB — ABO/RH: ABO/RH(D): O POS

## 2015-03-22 MED ORDER — MORPHINE SULFATE (PF) 4 MG/ML IV SOLN
4.0000 mg | Freq: Once | INTRAVENOUS | Status: AC
  Administered 2015-03-22: 4 mg via INTRAVENOUS
  Filled 2015-03-22: qty 1

## 2015-03-22 NOTE — ED Provider Notes (Signed)
CSN: [id number]     Arrival date & time 03/22/15  1003 History   First MD Initiated Contact with Patient 03/22/15 1051     Chief Complaint  Patient presents with  . Abdominal Pain   (Consider location/radiation/quality/duration/timing/severity/associated sxs/prior Treatment) Patient is a 30 y.o. female presenting with abdominal pain. The history is provided by the patient. No language interpreter was used.  Abdominal Pain Associated symptoms: no constipation, no diarrhea, no fever, no nausea and no vomiting     Ebony Haney is a 30 y.o female G5 P4 A0 with no past medical history who presents for left lower quadrant abdominal pain that began yesterday. She describes it as intermittent and sometimes excruciating. She took ibuprofen 800 mg at 7:00 last night. States her last menstrual cycle was March 12, 2015 and lasted 7 days. She also reports 2 episodes of bright red spotting including last night and this morning. Denies being on birth control. She reports having an STD in the past but does not remember which one. She denies any fever, chills, nausea, vomiting, diarrhea, dysuria, hematuria, or urinary frequency.  No past medical history on file. No past surgical history on file. No family history on file. Social History  Substance Use Topics  . Smoking status: Current Some Day Smoker -- 0.50 packs/day    Types: Cigarettes  . Smokeless tobacco: None  . Alcohol Use: No   OB History    Gravida Para Term Preterm AB TAB SAB Ectopic Multiple Living   4 2             Review of Systems  Constitutional: Negative for fever.  Gastrointestinal: Positive for abdominal pain. Negative for nausea, vomiting, diarrhea and constipation.  All other systems reviewed and are negative.     Allergies  Review of patient's allergies indicates no known allergies.  Home Medications   Prior to Admission medications   Medication Sig Start Date End Date Taking? Authorizing Provider  amoxicillin  (AMOXIL) 500 MG capsule Take 500 mg by mouth 3 (three) times daily.      Historical Provider, MD  amoxicillin (AMOXIL) 500 MG capsule Take 1 capsule (500 mg total) by mouth 3 (three) times daily. 11/14/12   Elson Areas, PA-C  cyclobenzaprine (FLEXERIL) 5 MG tablet Take 1 tablet (5 mg total) by mouth 3 (three) times daily as needed for muscle spasms. 10/20/14   Shon Baton, MD  ibuprofen (ADVIL,MOTRIN) 600 MG tablet Take 1 tablet (600 mg total) by mouth every 6 (six) hours as needed. 10/20/14   Shon Baton, MD  Prenatal Multivit-Min-Fe-FA (PRENATAL #2 PO) Take by mouth.      Historical Provider, MD  Prenatal Vit-Fe Fumarate-FA (PRENATAL COMPLETE) 14-0.4 MG TABS Take 1 capsule by mouth once. 11/14/12   Lonia Skinner Sofia, PA-C   BP 126/79 mmHg  Pulse 62  Temp(Src) 98.7 F (37.1 C) (Oral)  Resp 18  Ht  (1.778 m)  Wt 73.483 kg  BMI 23.24 kg/m2  SpO2 100%  LMP 03/12/2015 Physical Exam  Constitutional: She is oriented to person, place, and time. She appears well-developed and well-nourished. No distress.  HENT:  Head: Normocephalic and atraumatic.  Eyes: Conjunctivae are normal.  Neck: Normal range of motion. Neck supple.  Cardiovascular: Normal rate, regular rhythm and normal heart sounds.   Regular rate and rhythm. No murmur.  Pulmonary/Chest: Effort normal and breath sounds normal.  Lungs clear to auscultation bilaterally.  Abdominal: Soft. There is tenderness in the left lower quadrant.  There is no rebound and no guarding.    Left lower quadrant abdominal tenderness to palpation. No guarding or rebound. Normal-appearing abdomen. No distention. No CVA tenderness.  Genitourinary:  Pelvic exam: Chaperone present. Left sided adnexal tenderness. Mild vaginal bleeding from the cervical os but cervical os is closed.  Musculoskeletal: Normal range of motion.  Neurological: She is alert and oriented to person, place, and time.  Skin: Skin is warm and dry.  Nursing note and  vitals reviewed.   ED Course  Procedures (including critical care time) Labs Review Labs Reviewed  WET PREP, GENITAL - Abnormal; Notable for the following:    Clue Cells Wet Prep HPF POC PRESENT (*)    WBC, Wet Prep HPF POC MODERATE (*)    All other components within normal limits  URINALYSIS, ROUTINE W REFLEX MICROSCOPIC (NOT AT [facility]) - Abnormal; Notable for the following:    APPearance TURBID (*)    Hgb urine dipstick LARGE (*)    Ketones, ur 15 (*)    Leukocytes, UA LARGE (*)    All other components within normal limits  PREGNANCY, URINE - Abnormal; Notable for the following:    Preg Test, Ur POSITIVE (*)    All other components within normal limits  CBC WITH DIFFERENTIAL/PLATELET - Abnormal; Notable for the following:    WBC 3.8 (*)    All other components within normal limits  HCG, QUANTITATIVE, PREGNANCY - Abnormal; Notable for the following:    hCG, Beta Chain, Quant, S 575 (*)    All other components within normal limits  URINE MICROSCOPIC-ADD ON - Abnormal; Notable for the following:    Squamous Epithelial / LPF 0-5 (*)    Bacteria, UA MANY (*)    All other components within normal limits  URINE CULTURE  BASIC METABOLIC PANEL  ABO/RH  GC/CHLAMYDIA PROBE AMP () NOT AT [facility]    Imaging Review US Ob Comp Less 14 Wks  03/22/2015  CLINICAL DATA:  Left lower quadrant pain and spotting for 1 day, estimated gestational age of [redacted] week 3 days, quantitative beta HCG not available EXAM: OBSTETRIC <14 WK [place] AND TRANSVAGINAL OB US TECHNIQUE: Both transabdominal and transvaginal ultrasound examinations were performed for complete evaluation of the gestation as well as the maternal uterus, adnexal regions, and pelvic cul-de-sac. Transvaginal technique was performed to assess early pregnancy. COMPARISON:  None. FINDINGS: Intrauterine gestational sac: None visualized Yolk sac:  None Embryo:  None Cardiac Activity: None Maternal uterus/adnexae: The endometrium measures about 3 mm.  There is a small amount of fluid with debris in the endometrial canal. The right ovary is normal. The left ovary demonstrates a 12 x 11 x 13 mm mildly heterogeneous nodular area with peripheral enhancement. There is a small volume of free fluid in the pelvis. IMPRESSION: No evidence of intrauterine gestation currently. No specific evidence of ectopic pregnancy. Possible early corpus luteum left ovary. Nonspecific small volume free fluid in the pelvis. Differential diagnostic possibilities include intrauterine gestation that is too early to image, spontaneous abortion, or nonvisualized ectopic pregnancy. Close clinical and sonographic follow-up recommended. Electronically Signed   By: Esperanza Heir M.D.   On: 03/22/2015 12:29   US Ob Transvaginal  03/22/2015  CLINICAL DATA:  Left lower quadrant pain and spotting for 1 day, estimated gestational age of [redacted] week 3 days, quantitative beta HCG not available EXAM: OBSTETRIC <14 WK [place] AND TRANSVAGINAL OB US TECHNIQUE: Both transabdominal and transvaginal ultrasound examinations were performed for complete evaluation of the  gestation as well as the maternal uterus, adnexal regions, and pelvic cul-de-sac. Transvaginal technique was performed to assess early pregnancy. COMPARISON:  None. FINDINGS: Intrauterine gestational sac: None visualized Yolk sac:  None Embryo:  None Cardiac Activity: None Maternal uterus/adnexae: The endometrium measures about 3 mm. There is a small amount of fluid with debris in the endometrial canal. The right ovary is normal. The left ovary demonstrates a 12 x 11 x 13 mm mildly heterogeneous nodular area with peripheral enhancement. There is a small volume of free fluid in the pelvis. IMPRESSION: No evidence of intrauterine gestation currently. No specific evidence of ectopic pregnancy. Possible early corpus luteum left ovary. Nonspecific small volume free fluid in the pelvis. Differential diagnostic possibilities include intrauterine gestation  that is too early to image, spontaneous abortion, or nonvisualized ectopic pregnancy. Close clinical and sonographic follow-up recommended. Electronically Signed   By: Esperanza Heiraymond  Rubner M.D.   On: 03/22/2015 12:29   I have personally reviewed and evaluated these images and lab results as part of my medical decision-making.   EKG Interpretation None      MDM   Final diagnoses:  LLQ abdominal pain  Pregnant  Vaginal bleeding in pregnancy, first trimester   Patient presents for left lower quadrant abdominal pain that began yesterday. She did not know she was pregnant when she arrived in the ED but results were positive. There was suspicion for ectopic pregnancy. First day of last menstrual period was 03/13/2015. Patient has no urinary symptoms such as dysuria, hematuria, or urinary frequency. She did complain of some vaginal spotting. HCG today is 575. Otherwise, labs are normal. The ultrasound showed no evidence of intrauterine gestation or any specific evidence of ectopic pregnancy. It may be too early in the pregnancy. Patient will need repeat hCG within 48 hours and close follow-up with women's outpatient clinic. I discussed all of this with the patient. I also explained that she may be having a spontaneous abortion. Return precautions were thoroughly discussed. Patient agrees with plan. Filed Vitals:   03/22/15 1313 03/22/15 1326  BP: 119/87 126/79  Pulse: 78 62  Temp:    Resp: 16 18   Medications  morphine 4 MG/ML injection 4 mg (4 mg Intravenous Given 03/22/15 1136)       Catha GosselinHanna Patel-Mills, PA-C 03/22/15 1647  Loren Raceravid Yelverton, MD 03/23/15 1056

## 2015-03-22 NOTE — ED Notes (Signed)
Patient transported to radiology department via stretcher. 

## 2015-03-22 NOTE — ED Notes (Signed)
Pt states she started to lower abdominal pain yesterday with spotting.  Pt recently finished her period.

## 2015-03-22 NOTE — Discharge Instructions (Signed)
Abdominal Pain, Adult Return for increased abdominal pain, nausea, vomiting, worsening vaginal bleeding. Follow-up within 48 hours with women's outpatient clinic for repeat hCG level. Your level today was 545. Many things can cause belly (abdominal) pain. Most times, the belly pain is not dangerous. Many cases of belly pain can be watched and treated at home. HOME CARE   Do not take medicines that help you go poop (laxatives) unless told to by your doctor.  Only take medicine as told by your doctor.  Eat or drink as told by your doctor. Your doctor will tell you if you should be on a special diet. GET HELP IF:  You do not know what is causing your belly pain.  You have belly pain while you are sick to your stomach (nauseous) or have runny poop (diarrhea).  You have pain while you pee or poop.  Your belly pain wakes you up at night.  You have belly pain that gets worse or better when you eat.  You have belly pain that gets worse when you eat fatty foods.  You have a fever. GET HELP RIGHT AWAY IF:   The pain does not go away within 2 hours.  You keep throwing up (vomiting).  The pain changes and is only in the right or left part of the belly.  You have bloody or tarry looking poop. MAKE SURE YOU:   Understand these instructions.  Will watch your condition.  Will get help right away if you are not doing well or get worse.   This information is not intended to replace advice given to you by your health care provider. Make sure you discuss any questions you have with your health care provider.   Document Released: 06/19/2007 Document Revised: 01/21/2014 Document Reviewed: 09/09/2012 Elsevier Interactive Patient Education [employer]2016 [employer]

## 2015-03-23 LAB — GC/CHLAMYDIA PROBE AMP (~~LOC~~) NOT AT ARMC
CHLAMYDIA, DNA PROBE: NEGATIVE
NEISSERIA GONORRHEA: NEGATIVE

## 2015-03-24 LAB — URINE CULTURE
Culture: >=100000
Special Requests: NORMAL

## 2015-03-26 ENCOUNTER — Telehealth: Payer: Self-pay | Admitting: *Deleted

## 2015-03-26 NOTE — ED Notes (Signed)
(+)  urine culture, unable to contact by phone, letter sent.

## 2015-03-27 ENCOUNTER — Encounter: Payer: Self-pay | Admitting: Family Medicine

## 2015-03-27 ENCOUNTER — Other Ambulatory Visit: Payer: Self-pay

## 2015-03-27 DIAGNOSIS — Z3490 Encounter for supervision of normal pregnancy, unspecified, unspecified trimester: Secondary | ICD-10-CM

## 2015-03-27 LAB — HCG, QUANTITATIVE, PREGNANCY: hCG, Beta Chain, Quant, S: 905.8 m[IU]/mL — ABNORMAL HIGH

## 2015-03-28 ENCOUNTER — Inpatient Hospital Stay (HOSPITAL_COMMUNITY): Payer: Self-pay | Admitting: Anesthesiology

## 2015-03-28 ENCOUNTER — Encounter: Payer: Self-pay | Admitting: Obstetrics & Gynecology

## 2015-03-28 ENCOUNTER — Encounter (HOSPITAL_BASED_OUTPATIENT_CLINIC_OR_DEPARTMENT_OTHER): Payer: Self-pay | Admitting: Emergency Medicine

## 2015-03-28 ENCOUNTER — Emergency Department (HOSPITAL_BASED_OUTPATIENT_CLINIC_OR_DEPARTMENT_OTHER)
Admission: EM | Admit: 2015-03-28 | Discharge: 2015-03-28 | Disposition: A | Discharge status: Home / Self Care | Payer: No Typology Code available for payment source | Attending: Emergency Medicine | Admitting: Emergency Medicine

## 2015-03-28 ENCOUNTER — Encounter (HOSPITAL_BASED_OUTPATIENT_CLINIC_OR_DEPARTMENT_OTHER): Payer: Self-pay

## 2015-03-28 ENCOUNTER — Observation Stay (HOSPITAL_BASED_OUTPATIENT_CLINIC_OR_DEPARTMENT_OTHER)
Admission: EM | Admit: 2015-03-28 | Discharge: 2015-03-29 | Disposition: A | Discharge status: Home / Self Care | Payer: Self-pay | Attending: Obstetrics & Gynecology | Admitting: Obstetrics & Gynecology

## 2015-03-28 ENCOUNTER — Other Ambulatory Visit (HOSPITAL_BASED_OUTPATIENT_CLINIC_OR_DEPARTMENT_OTHER): Payer: Self-pay | Admitting: Emergency Medicine

## 2015-03-28 ENCOUNTER — Encounter (HOSPITAL_COMMUNITY)
Admission: EM | Disposition: A | Discharge status: Home / Self Care | Payer: Self-pay | Source: Home / Self Care | Attending: Emergency Medicine

## 2015-03-28 ENCOUNTER — Ambulatory Visit (HOSPITAL_BASED_OUTPATIENT_CLINIC_OR_DEPARTMENT_OTHER)
Admission: RE | Admit: 2015-03-28 | Discharge: 2015-03-28 | Disposition: A | Discharge status: Home / Self Care | Payer: Self-pay | Source: Ambulatory Visit | Attending: Emergency Medicine | Admitting: Emergency Medicine

## 2015-03-28 DIAGNOSIS — K661 Hemoperitoneum: Secondary | ICD-10-CM | POA: Diagnosis present

## 2015-03-28 DIAGNOSIS — R102 Pelvic and perineal pain: Secondary | ICD-10-CM

## 2015-03-28 DIAGNOSIS — O2331 Infections of other parts of urinary tract in pregnancy, first trimester: Secondary | ICD-10-CM | POA: Insufficient documentation

## 2015-03-28 DIAGNOSIS — O26891 Other specified pregnancy related conditions, first trimester: Secondary | ICD-10-CM

## 2015-03-28 DIAGNOSIS — Z79899 Other long term (current) drug therapy: Secondary | ICD-10-CM | POA: Insufficient documentation

## 2015-03-28 DIAGNOSIS — Z792 Long term (current) use of antibiotics: Secondary | ICD-10-CM | POA: Insufficient documentation

## 2015-03-28 DIAGNOSIS — O99331 Smoking (tobacco) complicating pregnancy, first trimester: Secondary | ICD-10-CM | POA: Insufficient documentation

## 2015-03-28 DIAGNOSIS — O26899 Other specified pregnancy related conditions, unspecified trimester: Secondary | ICD-10-CM

## 2015-03-28 DIAGNOSIS — Z3A01 Less than 8 weeks gestation of pregnancy: Secondary | ICD-10-CM | POA: Insufficient documentation

## 2015-03-28 DIAGNOSIS — F17210 Nicotine dependence, cigarettes, uncomplicated: Secondary | ICD-10-CM | POA: Insufficient documentation

## 2015-03-28 DIAGNOSIS — O00102 Left tubal pregnancy without intrauterine pregnancy: Secondary | ICD-10-CM

## 2015-03-28 DIAGNOSIS — O9989 Other specified diseases and conditions complicating pregnancy, childbirth and the puerperium: Secondary | ICD-10-CM | POA: Insufficient documentation

## 2015-03-28 DIAGNOSIS — N390 Urinary tract infection, site not specified: Secondary | ICD-10-CM

## 2015-03-28 DIAGNOSIS — R1909 Other intra-abdominal and pelvic swelling, mass and lump: Secondary | ICD-10-CM | POA: Insufficient documentation

## 2015-03-28 DIAGNOSIS — O001 Tubal pregnancy without intrauterine pregnancy: Principal | ICD-10-CM | POA: Insufficient documentation

## 2015-03-28 HISTORY — DX: Hemoperitoneum: K66.1

## 2015-03-28 HISTORY — PX: DIAGNOSTIC LAPAROSCOPY WITH REMOVAL OF ECTOPIC PREGNANCY: SHX6449

## 2015-03-28 HISTORY — DX: Left tubal pregnancy without intrauterine pregnancy: O00.102

## 2015-03-28 LAB — CBC
HEMATOCRIT: 33.5 % — AB (ref 36.0–46.0)
Hemoglobin: 10.8 g/dL — ABNORMAL LOW (ref 12.0–15.0)
MCH: 28.1 pg (ref 26.0–34.0)
MCHC: 32.2 g/dL (ref 30.0–36.0)
MCV: 87.2 fL (ref 78.0–100.0)
Platelets: 229 10*3/uL (ref 150–400)
RBC: 3.84 MIL/uL — AB (ref 3.87–5.11)
RDW: 12.9 % (ref 11.5–15.5)
WBC: 6.5 10*3/uL (ref 4.0–10.5)

## 2015-03-28 LAB — TYPE AND SCREEN
ABO/RH(D): O POS
Antibody Screen: NEGATIVE

## 2015-03-28 LAB — CBG MONITORING, ED: Glucose-Capillary: 98 mg/dL (ref 65–99)

## 2015-03-28 SURGERY — LAPAROSCOPY, WITH ECTOPIC PREGNANCY SURGICAL TREATMENT
Anesthesia: General

## 2015-03-28 MED ORDER — BUPIVACAINE HCL (PF) 0.5 % IJ SOLN
INTRAMUSCULAR | Status: DC | PRN
  Administered 2015-03-28: 13 mL
  Administered 2015-03-28: 7 mL

## 2015-03-28 MED ORDER — MIDAZOLAM HCL 2 MG/2ML IJ SOLN
INTRAMUSCULAR | Status: AC
  Filled 2015-03-28: qty 2

## 2015-03-28 MED ORDER — FENTANYL CITRATE (PF) 100 MCG/2ML IJ SOLN
INTRAMUSCULAR | Status: DC | PRN
  Administered 2015-03-28: 50 ug via INTRAVENOUS
  Administered 2015-03-28 (×2): 100 ug via INTRAVENOUS

## 2015-03-28 MED ORDER — NITROFURANTOIN MONOHYD MACRO 100 MG PO CAPS: 100.0000 mg | ORAL_CAPSULE | Freq: Two times a day (BID) | ORAL | Status: DC

## 2015-03-28 MED ORDER — MIDAZOLAM HCL 5 MG/5ML IJ SOLN
INTRAMUSCULAR | Status: DC | PRN
  Administered 2015-03-28: 2 mg via INTRAVENOUS

## 2015-03-28 MED ORDER — NEOSTIGMINE METHYLSULFATE 10 MG/10ML IV SOLN
INTRAVENOUS | Status: DC | PRN
  Administered 2015-03-28: 4 mg via INTRAVENOUS

## 2015-03-28 MED ORDER — ROCURONIUM BROMIDE 100 MG/10ML IV SOLN
INTRAVENOUS | Status: DC | PRN
  Administered 2015-03-28: 40 mg via INTRAVENOUS

## 2015-03-28 MED ORDER — LACTATED RINGERS IR SOLN
Status: DC | PRN
  Administered 2015-03-28: 3000 mL

## 2015-03-28 MED ORDER — ONDANSETRON HCL 4 MG/2ML IJ SOLN
INTRAMUSCULAR | Status: DC | PRN
  Administered 2015-03-28: 4 mg via INTRAVENOUS

## 2015-03-28 MED ORDER — CITRIC ACID-SODIUM CITRATE 334-500 MG/5ML PO SOLN
30.0000 mL | Freq: Once | ORAL | Status: AC
  Administered 2015-03-28: 30 mL via ORAL
  Filled 2015-03-28: qty 15

## 2015-03-28 MED ORDER — SUCCINYLCHOLINE CHLORIDE 200 MG/10ML IV SOSY
PREFILLED_SYRINGE | INTRAVENOUS | Status: DC | PRN
  Administered 2015-03-28: 140 mg via INTRAVENOUS

## 2015-03-28 MED ORDER — LIDOCAINE HCL (CARDIAC) 20 MG/ML IV SOLN
INTRAVENOUS | Status: AC
  Filled 2015-03-28: qty 5

## 2015-03-28 MED ORDER — NITROFURANTOIN MONOHYD MACRO 100 MG PO CAPS
100.0000 mg | ORAL_CAPSULE | Freq: Once | ORAL | Status: AC
  Administered 2015-03-28: 100 mg via ORAL
  Filled 2015-03-28: qty 1

## 2015-03-28 MED ORDER — PROPOFOL 10 MG/ML IV BOLUS
INTRAVENOUS | Status: DC | PRN
  Administered 2015-03-28: 150 mg via INTRAVENOUS

## 2015-03-28 MED ORDER — ONDANSETRON HCL 4 MG/2ML IJ SOLN
INTRAMUSCULAR | Status: AC
  Filled 2015-03-28: qty 2

## 2015-03-28 MED ORDER — GLYCOPYRROLATE 0.2 MG/ML IJ SOLN
INTRAMUSCULAR | Status: DC | PRN
  Administered 2015-03-28: 0.4 mg via INTRAVENOUS

## 2015-03-28 MED ORDER — PRENATAL COMPLETE 14-0.4 MG PO TABS: 1.0000 | ORAL_TABLET | Freq: Once | ORAL | Status: DC

## 2015-03-28 MED ORDER — LACTATED RINGERS IV BOLUS (SEPSIS)
1000.0000 mL | Freq: Once | INTRAVENOUS | Status: AC
  Administered 2015-03-28: 1000 mL via INTRAVENOUS

## 2015-03-28 MED ORDER — DEXAMETHASONE SODIUM PHOSPHATE 4 MG/ML IJ SOLN
INTRAMUSCULAR | Status: DC | PRN
  Administered 2015-03-28: 4 mg via INTRAVENOUS

## 2015-03-28 MED ORDER — PROPOFOL 10 MG/ML IV BOLUS
INTRAVENOUS | Status: AC
  Filled 2015-03-28: qty 20

## 2015-03-28 MED ORDER — ACETAMINOPHEN 325 MG PO TABS
650.0000 mg | ORAL_TABLET | Freq: Once | ORAL | Status: AC
  Administered 2015-03-28: 650 mg via ORAL
  Filled 2015-03-28: qty 2

## 2015-03-28 MED ORDER — FAMOTIDINE IN NACL 20-0.9 MG/50ML-% IV SOLN
20.0000 mg | Freq: Once | INTRAVENOUS | Status: AC
  Administered 2015-03-28: 20 mg via INTRAVENOUS
  Filled 2015-03-28: qty 50

## 2015-03-28 MED ORDER — GLYCOPYRROLATE 0.2 MG/ML IJ SOLN
INTRAMUSCULAR | Status: AC
  Filled 2015-03-28: qty 4

## 2015-03-28 MED ORDER — CEFAZOLIN SODIUM-DEXTROSE 2-3 GM-% IV SOLR
2.0000 g | INTRAVENOUS | Status: AC
  Administered 2015-03-28: 2 g via INTRAVENOUS
  Filled 2015-03-28: qty 50

## 2015-03-28 MED ORDER — LIDOCAINE HCL (CARDIAC) 20 MG/ML IV SOLN
INTRAVENOUS | Status: DC | PRN
  Administered 2015-03-28: 100 mg via INTRAVENOUS

## 2015-03-28 MED ORDER — FENTANYL CITRATE (PF) 250 MCG/5ML IJ SOLN
INTRAMUSCULAR | Status: AC
  Filled 2015-03-28: qty 5

## 2015-03-28 MED ORDER — NEOSTIGMINE METHYLSULFATE 10 MG/10ML IV SOLN
INTRAVENOUS | Status: AC
  Filled 2015-03-28: qty 1

## 2015-03-28 MED ORDER — DEXAMETHASONE SODIUM PHOSPHATE 10 MG/ML IJ SOLN
INTRAMUSCULAR | Status: AC
  Filled 2015-03-28: qty 1

## 2015-03-28 MED ORDER — LACTATED RINGERS IV SOLN
INTRAVENOUS | Status: DC
  Administered 2015-03-28 (×2): via INTRAVENOUS

## 2015-03-28 SURGICAL SUPPLY — 26 items
CABLE HIGH FREQUENCY MONO STRZ (ELECTRODE) IMPLANT
CATH ROBINSON RED A/P 16FR (CATHETERS) ×3 IMPLANT
CLOTH BEACON ORANGE TIMEOUT ST (SAFETY) ×3 IMPLANT
CONT SPECI 4OZ STER CLIK (MISCELLANEOUS) IMPLANT
DURAPREP 26ML APPLICATOR (WOUND CARE) ×3 IMPLANT
GLOVE BIO SURGEON STRL SZ 6.5 (GLOVE) ×2 IMPLANT
GLOVE BIO SURGEONS STRL SZ 6.5 (GLOVE) ×1
GLOVE BIOGEL PI IND STRL 7.0 (GLOVE) ×3 IMPLANT
GLOVE BIOGEL PI INDICATOR 7.0 (GLOVE) ×6
GLOVE ECLIPSE 7.0 STRL STRAW (GLOVE) ×3 IMPLANT
GOWN STRL REUS W/TWL LRG LVL3 (GOWN DISPOSABLE) ×6 IMPLANT
LIQUID BAND (GAUZE/BANDAGES/DRESSINGS) ×3 IMPLANT
NS IRRIG 1000ML POUR BTL (IV SOLUTION) ×3 IMPLANT
PACK LAPAROSCOPY BASIN (CUSTOM PROCEDURE TRAY) ×3 IMPLANT
POUCH SPECIMEN RETRIEVAL 10MM (ENDOMECHANICALS) ×3 IMPLANT
SCRUB PCMX 4 OZ (MISCELLANEOUS) ×6 IMPLANT
SET IRRIG TUBING LAPAROSCOPIC (IRRIGATION / IRRIGATOR) ×3 IMPLANT
SHEARS HARMONIC ACE PLUS 36CM (ENDOMECHANICALS) ×3 IMPLANT
SLEEVE XCEL OPT CAN 5 100 (ENDOMECHANICALS) ×3 IMPLANT
SUT MNCRL AB 4-0 PS2 18 (SUTURE) ×3 IMPLANT
SUT VICRYL 0 UR6 27IN ABS (SUTURE) IMPLANT
TOWEL OR 17X24 6PK STRL BLUE (TOWEL DISPOSABLE) ×6 IMPLANT
TRAY FOLEY CATH SILVER 14FR (SET/KITS/TRAYS/PACK) ×3 IMPLANT
TROCAR XCEL NON-BLD 11X100MML (ENDOMECHANICALS) ×3 IMPLANT
TROCAR XCEL NON-BLD 5MMX100MML (ENDOMECHANICALS) ×3 IMPLANT
WATER STERILE IRR 1000ML POUR (IV SOLUTION) ×3 IMPLANT

## 2015-03-28 NOTE — ED Notes (Signed)
Pt c/o left side abdominal cramping with some spotting, states she is [redacted] weeks pregnant

## 2015-03-28 NOTE — H&P (Signed)
Preoperative History and Physical  Ebony Haney is a 30 y.o. G4P2 here for surgical management of ruptured ectopic pregnancy diagnosed earlier today [facility].   She had a visit there on 03/22/15 and her BHCG was 575, nothing seen in the uterus or adnexa on ultrasound. She had a repeat BHCG in clinic on 03/27/15 that was 905.8 and a scheduled ultrasound for today.  Today, she complained of "excruciating" pain in her pelvis that began around midnight accompanied by vaginal spotting. She denies passage of clots or tissue. Pain was worse with movement or palpation. Ultrasound showed probable left adnexal ectopic pregnancy with moderate amount of free fluid concerning for rupture.  The plan was made to [facility] management.  [facility], patient reports continued RLQ pain.  No other acute preoperative concerns.  Proposed surgery: Laparoscopic unilateral salpingectomy and removal of ectopic pregnancy.  History reviewed. No pertinent past medical history. History reviewed. No pertinent past surgical history. OB History  Gravida Para Term Preterm AB SAB TAB Ectopic Multiple Living  5 4 4  0          # Outcome Date GA Lbr Len/2nd Weight Sex Delivery Anes PTL Lv  5 Current           4 Term           3 Term           2 Term           1 Term             Patient denies any other pertinent gynecologic issues.   No current facility-administered medications on file prior to encounter.   Current Outpatient Prescriptions on File Prior to Encounter  Medication Sig Dispense Refill  . nitrofurantoin, macrocrystal-monohydrate, (MACROBID) 100 MG capsule Take 1 capsule (100 mg total) by mouth 2 (two) times daily. X 7 days 14 capsule 0  . Prenatal Vit-Fe Fumarate-FA (PRENATAL COMPLETE) 14-0.4 MG TABS Take 1 capsule by mouth once. 60 each 1   No Known Allergies  Social History:   reports that she has been smoking Cigarettes.  She has  been smoking about 0.50 packs per day. She has never used smokeless tobacco. She reports that she does not drink alcohol or use illicit drugs.  History reviewed. No pertinent family history.  Review of Systems: Noncontributory  PHYSICAL EXAM: Blood pressure 123/77, pulse 69, temperature 98.4 F (36.9 C), temperature source Oral, resp. rate 18, last menstrual period 03/12/2015, SpO2 100 %. CONSTITUTIONAL: Well-developed, well-nourished female in no acute distress.  HENT:  Normocephalic, atraumatic, External right and left ear normal. Oropharynx is clear and moist EYES: Conjunctivae and EOM are normal. Pupils are equal, round, and reactive to light. No scleral icterus.  NECK: Normal range of motion, supple, no masses SKIN: Skin is warm and dry. No rash noted. Not diaphoretic. No erythema. No pallor. NEUROLOGIC: Alert and oriented to person, place, and time. Normal reflexes, muscle tone coordination. No cranial nerve deficit noted. PSYCHIATRIC: Normal mood and affect. Normal behavior. Normal judgment and thought content. CARDIOVASCULAR: Normal heart rate noted, regular rhythm RESPIRATORY: Effort and breath sounds normal, no problems with respiration noted ABDOMEN: Soft, nondistended, moderate RLQ tenderness to palpation, no rebound, no guarding PELVIC: Deferred MUSCULOSKELETAL: Normal range of motion. No edema and no tenderness. 2+ distal pulses.  Labs: Results for orders placed or performed during the hospital encounter of 03/28/15 (from the past 336 hour(s))  CBG monitoring, ED   Collection Time: 03/28/15  8:51 PM  Result Value Ref Range   Glucose-Capillary 98 65 - 99 mg/dL  CBC   Collection Time: 03/28/15 10:10 PM  Result Value Ref Range   WBC 6.5 4.0 - 10.5 K/uL   RBC 3.84 (L) 3.87 - 5.11 MIL/uL   Hemoglobin 10.8 (L) 12.0 - 15.0 g/dL   HCT 16.1 (L) 09.6 - 04.5 %   MCV 87.2 78.0 - 100.0 fL   MCH 28.1 26.0 - 34.0 pg   MCHC 32.2 30.0 - 36.0 g/dL   RDW 40.9 81.1 - 91.4 %    Platelets 229 150 - 400 K/uL  Results for orders placed or performed in visit on 03/27/15 (from the past 336 hour(s))  B-HCG Quant   Collection Time: 03/27/15  3:28 PM  Result Value Ref Range   hCG, Beta Chain, Quant, S 905.8 (H) mIU/mL  Results for orders placed or performed during the hospital encounter of 03/22/15 (from the past 336 hour(s))  GC/Chlamydia probe amp (Isabela)not at [facility]   Collection Time: 03/22/15 12:00 AM  Result Value Ref Range   Chlamydia Negative    Neisseria gonorrhea Negative   Urine culture   Collection Time: 03/22/15 10:32 AM  Result Value Ref Range   Specimen Description URINE, CLEAN CATCH    Special Requests Normal    Culture      >=100,000 COLONIES/mL ESCHERICHIA COLI Performed at [facility]    Report Status 03/24/2015 FINAL    Organism ID, Bacteria ESCHERICHIA COLI       Susceptibility   Escherichia coli - MIC*    AMPICILLIN >=32 RESISTANT Resistant     CEFAZOLIN <=4 SENSITIVE Sensitive     CEFTRIAXONE <=1 SENSITIVE Sensitive     CIPROFLOXACIN <=0.25 SENSITIVE Sensitive     GENTAMICIN <=1 SENSITIVE Sensitive     IMIPENEM <=0.25 SENSITIVE Sensitive     NITROFURANTOIN <=16 SENSITIVE Sensitive     TRIMETH/SULFA <=20 SENSITIVE Sensitive     AMPICILLIN/SULBACTAM >=32 RESISTANT Resistant     PIP/TAZO <=4 SENSITIVE Sensitive     * >=100,000 COLONIES/mL ESCHERICHIA COLI  Urinalysis, Routine w reflex microscopic (not at [facility])   Collection Time: 03/22/15 10:32 AM  Result Value Ref Range   Color, Urine YELLOW YELLOW   APPearance TURBID (A) CLEAR   Specific Gravity, Urine 1.026 1.005 - 1.030   pH 6.5 5.0 - 8.0   Glucose, UA NEGATIVE NEGATIVE mg/dL   Hgb urine dipstick LARGE (A) NEGATIVE   Bilirubin Urine NEGATIVE NEGATIVE   Ketones, ur 15 (A) NEGATIVE mg/dL   Protein, ur NEGATIVE NEGATIVE mg/dL   Nitrite NEGATIVE NEGATIVE   Leukocytes, UA LARGE (A) NEGATIVE  Pregnancy, urine   Collection Time: 03/22/15 10:32 AM  Result Value Ref  Range   Preg Test, Ur POSITIVE (A) NEGATIVE  Urine microscopic-add on   Collection Time: 03/22/15 10:32 AM  Result Value Ref Range   Squamous Epithelial / LPF 0-5 (A) NONE SEEN   WBC, UA 6-30 0 - 5 WBC/hpf   RBC / HPF 6-30 0 - 5 RBC/hpf   Bacteria, UA MANY (A) NONE SEEN   Urine-Other MUCOUS PRESENT   CBC with Differential   Collection Time: 03/22/15 11:25 AM  Result Value Ref Range   WBC 3.8 (L) 4.0 - 10.5 K/uL   RBC 4.56 3.87 - 5.11 MIL/uL   Hemoglobin 13.0 12.0 - 15.0 g/dL   HCT 78.2 95.6 - 21.3 %   MCV 87.5  78.0 - 100.0 fL   MCH 28.5 26.0 - 34.0 pg   MCHC 32.6 30.0 - 36.0 g/dL   RDW 45.4 09.8 - 11.9 %   Platelets 235 150 - 400 K/uL   Neutrophils Relative % 53 %   Neutro Abs 2.0 1.7 - 7.7 K/uL   Lymphocytes Relative 39 %   Lymphs Abs 1.5 0.7 - 4.0 K/uL   Monocytes Relative 7 %   Monocytes Absolute 0.3 0.1 - 1.0 K/uL   Eosinophils Relative 1 %   Eosinophils Absolute 0.0 0.0 - 0.7 K/uL   Basophils Relative 0 %   Basophils Absolute 0.0 0.0 - 0.1 K/uL  Basic metabolic panel   Collection Time: 03/22/15 11:25 AM  Result Value Ref Range   Sodium 137 135 - 145 mmol/L   Potassium 3.9 3.5 - 5.1 mmol/L   Chloride 104 101 - 111 mmol/L   CO2 23 22 - 32 mmol/L   Glucose, Bld 99 65 - 99 mg/dL   BUN 13 6 - 20 mg/dL   Creatinine, Ser 1.47 0.44 - 1.00 mg/dL   Calcium 9.1 8.9 - 82.9 mg/dL   GFR calc non Af Amer >60 >60 mL/min   GFR calc Af Amer >60 >60 mL/min   Anion gap 10 5 - 15  hCG, quantitative, pregnancy   Collection Time: 03/22/15 11:25 AM  Result Value Ref Range   hCG, Beta Chain, Quant, S 575 (H) <5 mIU/mL  ABO/Rh   Collection Time: 03/22/15 11:25 AM  Result Value Ref Range   ABO/RH(D) O POS    No rh immune globuloin      NOT A RH IMMUNE GLOBULIN CANDIDATE, PT RH POSITIVE Performed at [facility]   Wet prep, genital   Collection Time: 03/22/15 12:30 PM  Result Value Ref Range   Yeast Wet Prep HPF POC NONE SEEN NONE SEEN   Trich, Wet Prep NONE SEEN NONE  SEEN   Clue Cells Wet Prep HPF POC PRESENT (A) NONE SEEN   WBC, Wet Prep HPF POC MODERATE (A) NONE SEEN   Sperm NONE SEEN     Imaging Studies: US Ob Comp Less 14 Wks  04/01/2015  CLINICAL DATA:  Left adnexal pain for 6 days.  Rising beta HCG. EXAM: OBSTETRIC <14 WK [place] AND TRANSVAGINAL OB US TECHNIQUE: Both transabdominal and transvaginal ultrasound examinations were performed for complete evaluation of the gestation as well as the maternal uterus, adnexal regions, and pelvic cul-de-sac. Transvaginal technique was performed to assess early pregnancy. COMPARISON:  None. FINDINGS: Intrauterine gestational sac: No normal-appearing gestational sac seen. Complex avascular collection within the endometrial canal, measuring approximately 1.3 cm greatest dimension, is likely complex fluid and/or blood products. Maternal uterus/adnexae: There is a complex mass within the left adnexa, measuring 5.6 x 5.2 x 3.6 cm. Left ovary is not seen, presumably obscured by this mass. This is highly suspicious for ectopic pregnancy. Right ovary appears normal and there is no mass or free fluid in the right adnexal region. Moderate amount of additional free fluid noted within the cul-de-sac. IMPRESSION: 1. Complex/heterogeneous mass within the LEFT adnexa, measuring 5.6 x 5.2 x 3.6 cm. In the setting of a rising beta HCG level, and absence of a normal-appearing intrauterine pregnancy, this is highly suspicious for ectopic pregnancy. 2. Complex avascular collection within the endometrial canal, measuring approximately 1.3 cm greatest dimension, at least some component most likely being blood products. No normal intrauterine pregnancy identified. 3. Right ovary appears normal and there is no mass  or free fluid in the right adnexal region. Critical Value/emergent results were called by telephone at the time of interpretation on 03/28/2015 at 8:34 pm to Dr. Rolland Porter, who verbally acknowledged these results. Electronically Signed   By:  Bary Richard M.D.   On: 03/28/2015 20:40   US Ob Comp Less 14 Wks  03/22/2015  CLINICAL DATA:  Left lower quadrant pain and spotting for 1 day, estimated gestational age of [redacted] week 3 days, quantitative beta HCG not available EXAM: OBSTETRIC <14 WK [place] AND TRANSVAGINAL OB US TECHNIQUE: Both transabdominal and transvaginal ultrasound examinations were performed for complete evaluation of the gestation as well as the maternal uterus, adnexal regions, and pelvic cul-de-sac. Transvaginal technique was performed to assess early pregnancy. COMPARISON:  None. FINDINGS: Intrauterine gestational sac: None visualized Yolk sac:  None Embryo:  None Cardiac Activity: None Maternal uterus/adnexae: The endometrium measures about 3 mm. There is a small amount of fluid with debris in the endometrial canal. The right ovary is normal. The left ovary demonstrates a 12 x 11 x 13 mm mildly heterogeneous nodular area with peripheral enhancement. There is a small volume of free fluid in the pelvis. IMPRESSION: No evidence of intrauterine gestation currently. No specific evidence of ectopic pregnancy. Possible early corpus luteum left ovary. Nonspecific small volume free fluid in the pelvis. Differential diagnostic possibilities include intrauterine gestation that is too early to image, spontaneous abortion, or nonvisualized ectopic pregnancy. Close clinical and sonographic follow-up recommended. Electronically Signed   By: Esperanza Heir M.D.   On: 03/22/2015 12:29   US Ob Transvaginal  03/28/2015  CLINICAL DATA:  Left adnexal pain for 6 days.  Rising beta HCG. EXAM: OBSTETRIC <14 WK [place] AND TRANSVAGINAL OB US TECHNIQUE: Both transabdominal and transvaginal ultrasound examinations were performed for complete evaluation of the gestation as well as the maternal uterus, adnexal regions, and pelvic cul-de-sac. Transvaginal technique was performed to assess early pregnancy. COMPARISON:  None. FINDINGS: Intrauterine gestational sac: No  normal-appearing gestational sac seen. Complex avascular collection within the endometrial canal, measuring approximately 1.3 cm greatest dimension, is likely complex fluid and/or blood products. Maternal uterus/adnexae: There is a complex mass within the left adnexa, measuring 5.6 x 5.2 x 3.6 cm. Left ovary is not seen, presumably obscured by this mass. This is highly suspicious for ectopic pregnancy. Right ovary appears normal and there is no mass or free fluid in the right adnexal region. Moderate amount of additional free fluid noted within the cul-de-sac. IMPRESSION: 1. Complex/heterogeneous mass within the LEFT adnexa, measuring 5.6 x 5.2 x 3.6 cm. In the setting of a rising beta HCG level, and absence of a normal-appearing intrauterine pregnancy, this is highly suspicious for ectopic pregnancy. 2. Complex avascular collection within the endometrial canal, measuring approximately 1.3 cm greatest dimension, at least some component most likely being blood products. No normal intrauterine pregnancy identified. 3. Right ovary appears normal and there is no mass or free fluid in the right adnexal region. Critical Value/emergent results were called by telephone at the time of interpretation on 03/28/2015 at 8:34 pm to Dr. Rolland Porter, who verbally acknowledged these results. Electronically Signed   By: Bary Richard M.D.   On: 03/28/2015 20:40   US Ob Transvaginal  03/22/2015  CLINICAL DATA:  Left lower quadrant pain and spotting for 1 day, estimated gestational age of [redacted] week 3 days, quantitative beta HCG not available EXAM: OBSTETRIC <14 WK [place] AND TRANSVAGINAL OB US TECHNIQUE: Both transabdominal and transvaginal ultrasound examinations were  performed for complete evaluation of the gestation as well as the maternal uterus, adnexal regions, and pelvic cul-de-sac. Transvaginal technique was performed to assess early pregnancy. COMPARISON:  None. FINDINGS: Intrauterine gestational sac: None visualized Yolk sac:  None  Embryo:  None Cardiac Activity: None Maternal uterus/adnexae: The endometrium measures about 3 mm. There is a small amount of fluid with debris in the endometrial canal. The right ovary is normal. The left ovary demonstrates a 12 x 11 x 13 mm mildly heterogeneous nodular area with peripheral enhancement. There is a small volume of free fluid in the pelvis. IMPRESSION: No evidence of intrauterine gestation currently. No specific evidence of ectopic pregnancy. Possible early corpus luteum left ovary. Nonspecific small volume free fluid in the pelvis. Differential diagnostic possibilities include intrauterine gestation that is too early to image, spontaneous abortion, or nonvisualized ectopic pregnancy. Close clinical and sonographic follow-up recommended. Electronically Signed   By: Esperanza Heir M.D.   On: 03/22/2015 12:29    Assessment: Patient Active Problem List   Diagnosis Date Noted  . Ruptured left tubal ectopic pregnancy causing hemoperitoneum 03/28/2015    Plan: Patient will undergo surgical management with laparoscopic unilateral salpingectomy and removal of ectopic pregnancy.   The risks of surgery were discussed in detail with the patient including but not limited to: bleeding which may require transfusion or reoperation; infection which may require antibiotics; injury to surrounding organs which may involve bowel, bladder, ureters ; need for additional procedures including laparotomy; surgical site problems and other postoperative/anesthesia complications. Likelihood of success in alleviating the patient's condition was discussed. Routine postoperative instructions will be reviewed with the patient and her family in detail after surgery.  The patient concurred with the proposed plan, giving informed written consent for the surgery.  Patient has been NPO since 1400 she will remain NPO for procedure.  Anesthesia and OR aware.  Preoperative prophylactic antibiotics and SCDs ordered on call to the  OR.  To OR when ready.   Jaynie Collins, M.D. 03/28/2015 10:27 PM

## 2015-03-28 NOTE — ED Notes (Signed)
Pt in NAD at this time. Patient is talking on her cell phone.

## 2015-03-28 NOTE — ED Notes (Signed)
Pt return for US r/o ectopic preg and US positive EDP aware

## 2015-03-28 NOTE — Anesthesia Preprocedure Evaluation (Addendum)
Anesthesia Evaluation  Patient identified by MRN, date of birth, ID band Patient awake    Reviewed: Allergy & Precautions, NPO status , Patient's Chart, lab work & pertinent test results  Airway Mallampati: II  TM Distance: >3 FB Neck ROM: Full    Dental no notable dental hx.    Pulmonary Current Smoker,    Pulmonary exam normal breath sounds clear to auscultation       Cardiovascular negative cardio ROS Normal cardiovascular exam Rhythm:Regular Rate:Normal     Neuro/Psych negative neurological ROS  negative psych ROS   GI/Hepatic negative GI ROS, Neg liver ROS,   Endo/Other  negative endocrine ROS  Renal/GU negative Renal ROS  negative genitourinary   Musculoskeletal negative musculoskeletal ROS (+)   Abdominal   Peds negative pediatric ROS (+)  Hematology negative hematology ROS (+)   Anesthesia Other Findings   Reproductive/Obstetrics negative OB ROS                             Anesthesia Physical Anesthesia Plan  ASA: II and emergent  Anesthesia Plan: General   Post-op Pain Management:    Induction: Intravenous  Airway Management Planned: Oral ETT  Additional Equipment:   Intra-op Plan:   Post-operative Plan: Extubation in OR  Informed Consent: I have reviewed the patients History and Physical, chart, labs and discussed the procedure including the risks, benefits and alternatives for the proposed anesthesia with the patient or authorized representative who has indicated his/her understanding and acceptance.   Dental advisory given  Plan Discussed with: CRNA  Anesthesia Plan Comments:         Anesthesia Quick Evaluation

## 2015-03-28 NOTE — Op Note (Signed)
Ebony Haney PROCEDURE DATE: 03/28/2015  PREOPERATIVE DIAGNOSIS: Ruptured ectopic pregnancy POSTOPERATIVE DIAGNOSIS: Ruptured left fallopian tube ectopic pregnancy PROCEDURE: Laparoscopic left salpingectomy and removal of ectopic pregnancy SURGEON:  Dr. Jaynie CollinsUgonna Benay Pomeroy ANESTHESIOLOGY TEAM:  Anesthesiologist: Sherrian DiversBruce Denenny, MD CRNA: Collier FlowersElizabeth J Speight, CRNA; Renford DillsJanet L Mullins, CRNA  INDICATIONS: 30 y.o. [phone] [id number] here with the preoperative diagnosis as listed above.  Please refer to preoperative notes for more details. Patient was counseled regarding need for laparoscopic salpingectomy. Risks of surgery including bleeding which may require transfusion or reoperation, infection, injury to bowel or other surrounding organs, need for additional procedures including laparotomy and other postoperative/anesthesia complications were explained to patient.  Written informed consent was obtained.  FINDINGS:  Moderate amount of hemoperitoneum estimated to be about 500 ml of blood and clots.  Dilated left fallopian tube containing ectopic gestation. Small normal appearing uterus, normal right fallopian tube, right ovary and left ovary.  ANESTHESIA: General INTRAVENOUS FLUIDS: 1500 ml ESTIMATED BLOOD LOSS: 500 ml of hemoperitoneum; minimal intraoperative blood loss URINE OUTPUT: 150 ml SPECIMENS: Left fallopian tube containing ectopic gestation COMPLICATIONS: None immediate  PROCEDURE IN DETAIL:  The patient was taken to the operating room where general anesthesia was administered and was found to be adequate.  She was placed in the dorsal lithotomy position, and was prepped and draped in a sterile manner.  A Foley catheter was inserted into her bladder and attached to constant drainage and a uterine manipulator was then advanced into the uterus .    After an adequate timeout was performed, attention was turned to the abdomen where an umbilical incision was made with the scalpel.  The Optiview 11-mm trocar and  sleeve were then advanced without difficulty with the laparoscope under direct visualization into the abdomen.  The abdomen was then insufflated with carbon dioxide gas and adequate pneumoperitoneum was obtained.  A survey of the patient's pelvis and abdomen revealed the findings above.  Two 5-mm left lower quadrant ports were then placed under direct visualization.  The Nezhat suction irrigator was then used to suction the hemoperitoneum and irrigate the pelvis.  Attention was then turned to the left fallopian tube which was grasped and ligated from the underlying mesosalpinx and uterine attachment using the Harmonic instrument.  Good hemostasis was noted.  The specimen was placed in an EndoCatch bag and removed from the abdomen intact.  The abdomen was desufflated, and all instruments were removed.  The fascial incision of the 10-mm site was reapproximated with a 0 Vicryl figure-of-eight stitch; and all skin incisions were closed with 4-0 Vicryl. The patient tolerated the procedure well.  All instruments, needles, and sponge counts were correct x 3. The patient was taken to the recovery room in stable condition.   The patient will be discharged to home as per PACU criteria.  Routine postoperative instructions given.  She was prescribed Percocet, Ibuprofen and Colace.  She will follow up in the clinic in about 3-4 weeks for postoperative evaluation.   Jaynie CollinsUGONNA  Clayton Bosserman, MD, FACOG Attending Obstetrician & Gynecologist Faculty Practice, [facility] - 

## 2015-03-28 NOTE — ED Notes (Signed)
Pt verbalizes understanding of d/c instructions and denies any further needs at this time. 

## 2015-03-28 NOTE — ED Provider Notes (Signed)
CSN: [id number]     Arrival date & time 03/28/15  0330 History   First MD Initiated Contact with Patient 03/28/15 0450     Chief Complaint  Patient presents with  . Abdominal Pain     (Consider location/radiation/quality/duration/timing/severity/associated sxs/prior Treatment) HPI This is a [age] year old female who was found to be pregnant and had an ultrasound on the eighth of this month. The ultrasound did not yet show an intrauterine pregnancy. She followed up yesterday and had her beta hCG rechecked and it was found to be increasing. She is here complaining of "excruciating" pain in her pelvis that began around midnight. It is been accompanied by vaginal spotting. She denies passage of clots or tissue. Pain is worse with movement or palpation.  Urine culture done on March 8 shows urinary tract infection with Escherichia coli.  History reviewed. No pertinent past medical history. History reviewed. No pertinent past surgical history. No family history on file. Social History  Substance Use Topics  . Smoking status: Current Some Day Smoker -- 0.50 packs/day    Types: Cigarettes  . Smokeless tobacco: None  . Alcohol Use: No   OB History    Gravida Para Term Preterm AB TAB SAB Ectopic Multiple Living   4 2             Review of Systems  All other systems reviewed and are negative.   Allergies  Review of patient's allergies indicates no known allergies.  Home Medications   Prior to Admission medications   Medication Sig Start Date End Date Taking? Authorizing Provider  amoxicillin (AMOXIL) 500 MG capsule Take 500 mg by mouth 3 (three) times daily.      Historical Provider, MD  amoxicillin (AMOXIL) 500 MG capsule Take 1 capsule (500 mg total) by mouth 3 (three) times daily. 11/14/12   Elson AreasLeslie K Sofia, PA-C  cyclobenzaprine (FLEXERIL) 5 MG tablet Take 1 tablet (5 mg total) by mouth 3 (three) times daily as needed for muscle spasms. 10/20/14   Shon Batonourtney F Horton, MD  ibuprofen  (ADVIL,MOTRIN) 600 MG tablet Take 1 tablet (600 mg total) by mouth every 6 (six) hours as needed. 10/20/14   Shon Batonourtney F Horton, MD  Prenatal Multivit-Min-Fe-FA (PRENATAL #2 PO) Take by mouth.      Historical Provider, MD  Prenatal Vit-Fe Fumarate-FA (PRENATAL COMPLETE) 14-0.4 MG TABS Take 1 capsule by mouth once. Patient not taking: Reported on 03/28/2015 11/14/12   Elson AreasLeslie K Sofia, PA-C   BP 127/85 mmHg  Pulse 84  Temp(Src) 98.1 F (36.7 C) (Oral)  Resp 18  Ht 5\' 6"  (1.676 m)  Wt 165 lb (74.844 kg)  BMI 26.64 kg/m2  SpO2 100%  LMP 03/12/2015   Physical Exam  General: Well-developed, well-nourished female in no acute distress; appearance consistent with age of record HENT: normocephalic; atraumatic Eyes: pupils equal, round and reactive to light; extraocular muscles intact Neck: supple Heart: regular rate and rhythm Lungs: clear to auscultation bilaterally Abdomen: soft; nondistended; suprapubic tenderness; no masses or hepatosplenomegaly; bowel sounds present Extremities: No deformity; full range of motion; pulses normal Neurologic: Awake, alert and oriented; motor function intact in all extremities and symmetric; no facial droop Skin: Warm and dry Psychiatric: Normal mood and affect    ED Course  Procedures (including critical care time)   MDM  We'll treat for urinary tract infection. Patient advised that narcotics are not appropriate at this early stage of pregnancy. We will schedule her for follow-up ultrasound later today.   Mihran Lebarron,  MD 03/28/15 0500

## 2015-03-28 NOTE — ED Notes (Signed)
Report given at thsi time to [facility].

## 2015-03-28 NOTE — ED Provider Notes (Addendum)
CSN: [id number]     Arrival date & time 03/28/15  2042 History   First MD Initiated Contact with Patient 03/28/15 2045     Chief Complaint  Patient presents with  . Ectopic Pregnancy      HPI  Patient presents for evaluation of an ectopic pregnancy.  G5P4 with last menstrual period in mid February. Seen and evaluated here on the eighth with a quantitative 580. Ultrasound did not show intrauterine, or extrauterine pregnancy. [facility] Did so on Monday, 13th. A quantitative hCG of just over 900.  Presents early this morning with pain and bleeding. Was stable. Scheduled for outpatient ultrasound this morning. Ultrasound was obtained tonight, when patient returnd. I received a call for radiology regarding this. She was properly directed from ultrasound, back to the front emergency room.  History reviewed. No pertinent past medical history. History reviewed. No pertinent past surgical history. History reviewed. No pertinent family history. Social History  Substance Use Topics  . Smoking status: Current Some Day Smoker -- 0.50 packs/day    Types: Cigarettes  . Smokeless tobacco: Never Used  . Alcohol Use: No   OB History    Gravida Para Term Preterm AB TAB SAB Ectopic Multiple Living   4 2             Review of Systems  Constitutional: Negative for fever, chills, diaphoresis, appetite change and fatigue.  HENT: Negative for mouth sores, sore throat and trouble swallowing.   Eyes: Negative for visual disturbance.  Respiratory: Negative for cough, chest tightness, shortness of breath and wheezing.   Cardiovascular: Negative for chest pain.  Gastrointestinal: Negative for nausea, vomiting, abdominal pain, diarrhea and abdominal distention.  Endocrine: Negative for polydipsia, polyphagia and polyuria.  Genitourinary: Positive for vaginal bleeding and pelvic pain. Negative for dysuria, frequency and hematuria.  Musculoskeletal: Negative  for gait problem.  Skin: Negative for color change, pallor and rash.  Neurological: Negative for dizziness, syncope, light-headedness and headaches.  Hematological: Does not bruise/bleed easily.  Psychiatric/Behavioral: Negative for behavioral problems and confusion.      Allergies  Review of patient's allergies indicates no known allergies.  Home Medications   Prior to Admission medications   Medication Sig Start Date End Date Taking? Authorizing Provider  nitrofurantoin, macrocrystal-monohydrate, (MACROBID) 100 MG capsule Take 1 capsule (100 mg total) by mouth 2 (two) times daily. X 7 days 03/28/15   Paula LibraJohn Molpus, MD  Prenatal Vit-Fe Fumarate-FA (PRENATAL COMPLETE) 14-0.4 MG TABS Take 1 capsule by mouth once. 03/28/15   John Molpus, MD   BP 129/71 mmHg  Pulse 87  Temp(Src) 98.1 F (36.7 C) (Oral)  SpO2 100%  LMP 03/12/2015 Physical Exam  Constitutional: She is oriented to person, place, and time. She appears well-developed and well-nourished. No distress.  HENT:  Head: Normocephalic.  Eyes: Conjunctivae are normal. Pupils are equal, round, and reactive to light. No scleral icterus.  Neck: Normal range of motion. Neck supple. No thyromegaly present.  Cardiovascular: Normal rate and regular rhythm.  Exam reveals no gallop and no friction rub.   No murmur heard. Pulmonary/Chest: Effort normal and breath sounds normal. No respiratory distress. She has no wheezes. She has no rales.  Abdominal: Soft. Bowel sounds are normal. She exhibits no distension. There is no tenderness. There is no rebound.    Musculoskeletal: Normal range of motion.  Neurological: She is alert and oriented to person, place, and time.  Skin: Skin is warm and dry. No rash  noted.  Psychiatric: She has a normal mood and affect. Her behavior is normal.    ED Course  Procedures (including critical care time) Labs Review Labs Reviewed  BASIC METABOLIC PANEL  CBG MONITORING, ED    Imaging Review US Ob Comp  Less 14 Wks  03/28/2015  CLINICAL DATA:  Left adnexal pain for 6 days.  Rising beta HCG. EXAM: OBSTETRIC <14 WK [place] AND TRANSVAGINAL OB US TECHNIQUE: Both transabdominal and transvaginal ultrasound examinations were performed for complete evaluation of the gestation as well as the maternal uterus, adnexal regions, and pelvic cul-de-sac. Transvaginal technique was performed to assess early pregnancy. COMPARISON:  None. FINDINGS: Intrauterine gestational sac: No normal-appearing gestational sac seen. Complex avascular collection within the endometrial canal, measuring approximately 1.3 cm greatest dimension, is likely complex fluid and/or blood products. Maternal uterus/adnexae: There is a complex mass within the left adnexa, measuring 5.6 x 5.2 x 3.6 cm. Left ovary is not seen, presumably obscured by this mass. This is highly suspicious for ectopic pregnancy. Right ovary appears normal and there is no mass or free fluid in the right adnexal region. Moderate amount of additional free fluid noted within the cul-de-sac. IMPRESSION: 1. Complex/heterogeneous mass within the LEFT adnexa, measuring 5.6 x 5.2 x 3.6 cm. In the setting of a rising beta HCG level, and absence of a normal-appearing intrauterine pregnancy, this is highly suspicious for ectopic pregnancy. 2. Complex avascular collection within the endometrial canal, measuring approximately 1.3 cm greatest dimension, at least some component most likely being blood products. No normal intrauterine pregnancy identified. 3. Right ovary appears normal and there is no mass or free fluid in the right adnexal region. Critical Value/emergent results were called by telephone at the time of interpretation on 03/28/2015 at 8:34 pm to Dr. Rolland Porter, who verbally acknowledged these results. Electronically Signed   By: Bary Richard M.D.   On: 03/28/2015 20:40   US Ob Transvaginal  03/28/2015  CLINICAL DATA:  Left adnexal pain for 6 days.  Rising beta HCG. EXAM: OBSTETRIC <14  WK [place] AND TRANSVAGINAL OB US TECHNIQUE: Both transabdominal and transvaginal ultrasound examinations were performed for complete evaluation of the gestation as well as the maternal uterus, adnexal regions, and pelvic cul-de-sac. Transvaginal technique was performed to assess early pregnancy. COMPARISON:  None. FINDINGS: Intrauterine gestational sac: No normal-appearing gestational sac seen. Complex avascular collection within the endometrial canal, measuring approximately 1.3 cm greatest dimension, is likely complex fluid and/or blood products. Maternal uterus/adnexae: There is a complex mass within the left adnexa, measuring 5.6 x 5.2 x 3.6 cm. Left ovary is not seen, presumably obscured by this mass. This is highly suspicious for ectopic pregnancy. Right ovary appears normal and there is no mass or free fluid in the right adnexal region. Moderate amount of additional free fluid noted within the cul-de-sac. IMPRESSION: 1. Complex/heterogeneous mass within the LEFT adnexa, measuring 5.6 x 5.2 x 3.6 cm. In the setting of a rising beta HCG level, and absence of a normal-appearing intrauterine pregnancy, this is highly suspicious for ectopic pregnancy. 2. Complex avascular collection within the endometrial canal, measuring approximately 1.3 cm greatest dimension, at least some component most likely being blood products. No normal intrauterine pregnancy identified. 3. Right ovary appears normal and there is no mass or free fluid in the right adnexal region. Critical Value/emergent results were called by telephone at the time of interpretation on 03/28/2015 at 8:34 pm to Dr. Rolland Porter, who verbally acknowledged these results. Electronically Signed   By:  Bary Richard M.D.   On: 03/28/2015 20:40   I have personally reviewed and evaluated these images and lab results as part of my medical decision-making.   EKG Interpretation None      MDM   Final diagnoses:  Ectopic pregnancy    I received a call from  radiology regarding this patient's ultrasound even prior to her checking into the emergency room. Within a few minutes she was out front at triage, having been directed there by the ultrasound tech. I asked that she be brought directly to her room. I reexamined her in room 1. I left the care of another patient to receive a phone call from radiology, and continued with her care immediately upon her arrival. She has tenderness in the left adnexa but is hemolytically stable. All labs are pending at this point. I contacted Dr.Anyanwa at [facility]. She was familiar with the patient due to her beta hCG from yesterday. She made except the patient in transfer. I immediately asked charge nurse to tell flank for emergent transfer to [facility], MD 03/28/15 1610  Rolland Porter, MD 03/28/15 2300

## 2015-03-28 NOTE — ED Notes (Signed)
Pt states she is having left lower abdominal pain and spotting that started an hour ago.  She was seen a few days ago and determined to have an IUP with ovarian cyst.  She had her beta hcg drawn today and it is increasing, was told to follow up with OBGYN.

## 2015-03-28 NOTE — Anesthesia Procedure Notes (Signed)
Procedure Name: Intubation Date/Time: 03/28/2015 10:51 PM Performed by: Collier FlowersSPEIGHT, Nicholson Starace J Pre-anesthesia Checklist: Patient identified, Emergency Drugs available, Suction available, Patient being monitored and Timeout performed Patient Re-evaluated:Patient Re-evaluated prior to inductionOxygen Delivery Method: Circle system utilized Preoxygenation: Pre-oxygenation with 100% oxygen Intubation Type: Cricoid Pressure applied and Rapid sequence Laryngoscope Size: Mac and 3 Grade View: Grade I Tube type: Oral Tube size: 7.0 mm Number of attempts: 1 Airway Equipment and Method: Stylet Placement Confirmation: ETT inserted through vocal cords under direct vision,  positive ETCO2 and breath sounds checked- equal and bilateral Secured at: 20 (at lips) cm Tube secured with: Tape Dental Injury: Teeth and Oropharynx as per pre-operative assessment

## 2015-03-29 ENCOUNTER — Encounter (HOSPITAL_COMMUNITY): Payer: Self-pay | Admitting: Obstetrics & Gynecology

## 2015-03-29 DIAGNOSIS — K661 Hemoperitoneum: Secondary | ICD-10-CM

## 2015-03-29 DIAGNOSIS — O001 Tubal pregnancy without intrauterine pregnancy: Secondary | ICD-10-CM

## 2015-03-29 LAB — ABO/RH: ABO/RH(D): O POS

## 2015-03-29 LAB — RPR: RPR Ser Ql: NONREACTIVE

## 2015-03-29 MED ORDER — DOCUSATE SODIUM 100 MG PO CAPS: 100.0000 mg | ORAL_CAPSULE | Freq: Two times a day (BID) | ORAL | Status: DC | PRN

## 2015-03-29 MED ORDER — HYDROMORPHONE HCL 1 MG/ML IJ SOLN: 0.2500 mg | INTRAMUSCULAR | Status: DC | PRN

## 2015-03-29 MED ORDER — PROMETHAZINE HCL 25 MG/ML IJ SOLN: 6.2500 mg | INTRAMUSCULAR | Status: DC | PRN

## 2015-03-29 MED ORDER — HYDROMORPHONE HCL 1 MG/ML IJ SOLN
INTRAMUSCULAR | Status: DC | PRN
  Administered 2015-03-29: 1 mg via INTRAVENOUS

## 2015-03-29 MED ORDER — IBUPROFEN 600 MG PO TABS: 600.0000 mg | ORAL_TABLET | Freq: Four times a day (QID) | ORAL | Status: DC | PRN

## 2015-03-29 MED ORDER — OXYCODONE-ACETAMINOPHEN 5-325 MG PO TABS: 1.0000 | ORAL_TABLET | Freq: Four times a day (QID) | ORAL | Status: DC | PRN

## 2015-03-29 MED ORDER — OXYCODONE-ACETAMINOPHEN 5-325 MG PO TABS
1.0000 | ORAL_TABLET | ORAL | Status: DC | PRN
  Administered 2015-03-29: 1 via ORAL

## 2015-03-29 MED ORDER — OXYCODONE-ACETAMINOPHEN 5-325 MG PO TABS
ORAL_TABLET | ORAL | Status: AC
  Filled 2015-03-29: qty 1

## 2015-03-29 MED ORDER — HYDROMORPHONE HCL 1 MG/ML IJ SOLN
INTRAMUSCULAR | Status: AC
  Filled 2015-03-29: qty 1

## 2015-03-29 NOTE — Anesthesia Postprocedure Evaluation (Signed)
Anesthesia Post Note  Patient: Ebony Haney  Procedure(s) Performed: Procedure(s) (LRB): DIAGNOSTIC LAPAROSCOPY WITH REMOVAL OF ECTOPIC PREGNANCY (N/A)  Patient location during evaluation: PACU Anesthesia Type: General Level of consciousness: awake and alert Pain management: pain level controlled Vital Signs Assessment: post-procedure vital signs reviewed and stable Respiratory status: spontaneous breathing, nonlabored ventilation, respiratory function stable and patient connected to nasal cannula oxygen Cardiovascular status: blood pressure returned to baseline and stable Postop Assessment: no signs of nausea or vomiting Anesthetic complications: no    Last Vitals:  Filed Vitals:   03/29/15 0030 03/29/15 0045  BP: 125/87 116/80  Pulse: 64 64  Temp:    Resp: 16 13    Last Pain:  Filed Vitals:   03/29/15 0108  PainSc: 10-Worst pain ever                 Marley Charlot J

## 2015-03-29 NOTE — Discharge Summary (Signed)
Gynecology Physician Postoperative Discharge Summary  Patient ID: Ebony Haney MRN: [id number] DOB/AGE: 32/01/1985 32 y.o.  Admit Date: 03/28/2015 Discharge Date: 03/29/2015  Preoperative Diagnoses: Ruptured left fallopian tube ectopic pregnancy  Procedures: Laparoscopic left salpingectomy and removal of ectopic pregnancy  Hospital Course:  Ebony Haney is a 30 y.o. [id number] admitted for surgery.  She underwent the procedures as mentioned above, her operation was uncomplicated. For further details about surgery, please refer to the operative report. Patient had an uncomplicated immediate postoperative course. By time of discharge around two hours after surgery, her pain was controlled on oral pain medications; she was ambulating, voiding without difficulty and tolerating oral intake. She was deemed stable for discharge to home.   Significant Labs: CBC Latest Ref Rng 03/28/2015 03/22/2015 11/07/2010  WBC 4.0 - 10.5 K/uL 6.5 3.8(L) 6.5  Hemoglobin 12.0 - 15.0 g/dL 10.8(L) 13.0 12.0  Hematocrit 36.0 - 46.0 % 33.5(L) 39.9 36.2  Platelets 150 - 400 K/uL 229 235 202    Discharge Exam: Blood pressure 123/77, pulse 69, temperature 98.4 F (36.9 C), temperature source Oral, resp. rate 18, last menstrual period 03/12/2015, SpO2 100 %. General appearance: alert and no distress  Resp: clear to auscultation bilaterally  Cardio: regular rate and rhythm  GI: soft, non-tender; bowel sounds normal; no masses, no organomegaly.  Incision: C/D/I, no erythema, no drainage noted Pelvic: scant blood on pad  Extremities: extremities normal, atraumatic, no cyanosis or edema and Homans sign is negative, no sign of DVT  Discharged Condition: Stable  Disposition: 01-Home or Self Care     Medication List    STOP taking these medications        PRENATAL COMPLETE 14-0.4 MG Tabs      TAKE these medications        docusate sodium 100 MG capsule  Commonly known as:  COLACE  Take 1 capsule (100 mg total)  by mouth 2 (two) times daily as needed.     ibuprofen 600 MG tablet  Commonly known as:  ADVIL,MOTRIN  Take 1 tablet (600 mg total) by mouth every 6 (six) hours as needed.     nitrofurantoin (macrocrystal-monohydrate) 100 MG capsule  Commonly known as:  MACROBID  Take 1 capsule (100 mg total) by mouth 2 (two) times daily. X 7 days     oxyCODONE-acetaminophen 5-325 MG tablet  Commonly known as:  PERCOCET/ROXICET  Take 1-2 tablets by mouth every 6 (six) hours as needed.           Follow-up Information    [facility].   Specialty:  Obstetrics and Gynecology   Why:  You will be called with appointment time and date., Call clinic or go to ED for any concerning issues   Contact information:   [address] [place] [phone]      Signed:  Jaynie CollinsUGONNA  Tashe Purdon, MD, FACOG Attending Obstetrician & Gynecologist Faculty Practice, [facility]

## 2015-03-29 NOTE — Discharge Instructions (Signed)
Laparoscopic Surgery - Care After Laparoscopy is a surgical procedure. It is used to diagnose and treat diseases inside the belly(abdomen). It is usually a brief, common, and relatively simple procedure. The laparoscopeis a thin, lighted, pencil-sized instrument. It is like a telescope. It is inserted into your abdomen through a small cut (incision). Your caregiver can look at the organs inside your body through this instrument.  She can see if there is anything abnormal. Laparoscopy can be done either in a hospital or outpatient clinic. You may be given a mild sedative to help you relax before the procedure. Once in the operating room, you will be given a drug to make you sleep (general anesthesia). Laparoscopy usually lasts about 1 hour. After the procedure, you will be monitored in a recovery area until you are stable and doing well. Once you are home, it may take 3 to 7 days to fully recover.  Laparoscopy has relatively few risks. Your caregiver will discuss the risks with you before the procedure. Some problems that can occur include: RISKS AND COMPLICATIONS  Allergies to medicines. Difficulty breathing. Bleeding. Infection. Damage to other surrounding structures HOME CARE INSTRUCTIONS  Infection. Bleeding. Damage to other organs. Anesthetic side effects.  Need for additional procedures such as open procedures/laparotomy PROCEDURE Once you receive anesthesia, your surgeon inflates the abdomen with a harmless gas (carbon dioxide). This makes the organs easier to see. The laparoscope is inserted into the abdomen through a small incision. This allows your surgeon to see into the abdomen. Other small instruments are also inserted into the abdomen through other small openings. Many surgeons attach a video camera to the laparoscope to enlarge the view. During a laparoscopy, the surgeon may be looking for inflammation, infection, or cancer.  The surgeon may also need to take out certain organs or  take tissue samples (biopsies). The specimens are sent to a specialist in looking at cells and tissue samples (pathologist). The pathologist examines them under a microscope to help to diagnose or confirm a disease. AFTER THE PROCEDURE  The incisions are closed with stitches (sutures) and Dermabond. Because these incisions are small (usually less than 1/2 inch), there is usually minimal discomfort after the procedure. There may also be discomfort from the instrument placement incisions in the abdomen. You will be given pain medicine to ease any discomfort. You will rest in a recovery room for 1-2 hours until you are stable and doing well. You may have some mild discomfort in the throat. This is from the tube placed in your throat while you were sleeping. You may experience discomfort in the shoulder area from some trapped air between the liver and diaphragm. This sensation is normal and will slowly go away on its own. The recovery time is shortened as long as there are no complications. You will rest in a recovery room until stable and doing well. As long as there are no complications, you may be allowed to go home. Someone will need to drive you home and be with you for at least 24 hours once home. FINDING OUT THE RESULTS You will be called with the results of the pathology and will discuss these results with  your caregiver during your postoperative appointment. Do not assume everything is normal if you have not heard from your caregiver or the medical facility. It is important for you to follow up on all of your results. HOME CARE INSTRUCTIONS  Take all medicines as directed. Only take over-the-counter or prescription medicines for pain, discomfort,   CARE INSTRUCTIONS   Take all medicines as directed.  Only take over-the-counter or prescription medicines for pain, discomfort, or fever as directed by your caregiver.  Resume daily activities as directed.  Showers are preferred over baths.  You may resume sexual activities in 1 week or as directed.  Do not drive while taking  narcotics. SEEK MEDICAL CARE IF:  There is increasing abdominal pain.  You feel lightheaded or faint.  You have the chills.  You have an oral temperature above 102 F (38.9 C).  There is pus-like (purulent) drainage from any of the wounds.  You are unable to pass gas or have a bowel movement.  You feel sick to your stomach (nauseous) or throw up (vomit). MAKE SURE YOU:   Understand these instructions.  Will watch your condition.  Will get help right away if you are not doing well or get worse.  ExitCare Patient Information 2013 [place]   Ruptured Ectopic Pregnancy An ectopic pregnancy is when the fertilized egg attaches (implants) outside the uterus. Most ectopic pregnancies occur in the fallopian tube. Rarely do ectopic pregnancies occur on the ovary, intestine, pelvis, or cervix. An ectopic pregnancy does not have the ability to develop into a normal, healthy baby.  A ruptured ectopic pregnancy is one in which the fallopian tube gets torn or bursts and results in internal bleeding. Often there is intense abdominal pain, and sometimes, vaginal bleeding. Having an ectopic pregnancy can be a life-threatening experience. If left untreated, this dangerous condition can lead to a blood transfusion, abdominal surgery, or even death.  CAUSES  Damage to the fallopian tubes is the suspected cause in most ectopic pregnancies.  RISK FACTORS Depending on your circumstances, the amount of risk of having an ectopic pregnancy will vary. There are 3 categories that may help you identify whether you are potentially at risk. High Risk  You have gone through infertility treatment.  You have had a previous ectopic pregnancy.  You have had previous tubal surgery.  You have had previous surgery to have the fallopian tubes tied (tubal ligation).  You have tubal problems or diseases.  You have been exposed to DES. DES is a medicine that was used until 1971 and had effects on babies  whose mothers took the medicine.  You become pregnant while using an intrauterine device (IUD) for birth control. Moderate Risk  You have a history of infertility.  You have a history of a sexually transmitted infection (STI).  You have a history of pelvic inflammatory disease (PID).  You have scarring from endometriosis.  You have multiple sexual partners.  You smoke. Low Risk  You have had previous pelvic surgery.  You use vaginal douching.  You became sexually active before [age] years of age. SYMPTOMS An ectopic pregnancy should be suspected in anyone who has missed a period and has abdominal pain or bleeding.  You may experience normal pregnancy symptoms, such as:  Nausea.  Tiredness.  Breast tenderness.  Symptoms that are not normal include:  Pain with intercourse.  Irregular vaginal bleeding or spotting.  Cramping or pain on one side, or in the lower abdomen.  Fast heartbeat.  Passing out while having a bowel movement.  Symptoms of a ruptured ectopic pregnancy and internal bleeding may include:  Sudden, severe pain in the abdomen and pelvis.  Dizziness or fainting.  Pain in the shoulder area. DIAGNOSIS  Tests that may be performed include:  A pregnancy test.  An ultrasound.  Testing the specific level  of pregnancy hormone in the bloodstream.  Taking a sample of uterus tissue (dilation and curettage, D&C).  Surgery to perform a visual exam of the inside of the abdomen using a lighted tube (laparoscopy). TREATMENT  Laparoscopic surgery or abdominal surgery is recommended for a ruptured ectopic pregnancy.   The whole fallopian tube may need to be removed (salpingectomy).  If the tube is not too damaged, the tube may be saved, and the pregnancy will be surgically removed. Intime, the tube may still function.  If you have lost a lot of blood, you may need a blood transfusion.  You may receive a Rho (D) immune globulin shot if you are Rh  negative and the father is Rh positive, or if you do not know the Rh type of the father. This is to prevent problems with any future pregnancy. SEEK IMMEDIATE MEDICAL CARE IF:  You have any symptoms of an ectopic or ruptured ectopic pregnancy. This is a medical emergency. MAKE SURE YOU:  Understand these instructions.  Will watch your condition.  Will get help right away if you are not doing well or get worse.   This information is not intended to replace advice given to you by your health care provider. Make sure you discuss any questions you have with your health care provider.   Document Released: 12/29/1999 Document Revised: 01/05/2013 Document Reviewed: 10/12/2012 Elsevier Interactive Patient Education 2016 [employer] Inc.  General Anesthesia, Adult, Care After Refer to this sheet in the next few weeks. These instructions provide you with information on caring for yourself after your procedure. Your health care provider may also give you more specific instructions. Your treatment has been planned according to current medical practices, but problems sometimes occur. Call your health care provider if you have any problems or questions after your procedure. WHAT TO EXPECT AFTER THE PROCEDURE After the procedure, it is typical to experience:  Sleepiness.  Nausea and vomiting. HOME CARE INSTRUCTIONS  For the first 24 hours after general anesthesia:  Have a responsible person with you.  Do not drive a car. If you are alone, do not take public transportation.  Do not drink alcohol.  Do not take medicine that has not been prescribed by your health care provider.  Do not sign important papers or make important decisions.  You may resume a normal diet and activities as directed by your health care provider.  Change bandages (dressings) as directed.  If you have questions or problems that seem related to general anesthesia, call the hospital and ask for the anesthetist or  anesthesiologist on call. SEEK MEDICAL CARE IF:  You have nausea and vomiting that continue the day after anesthesia.  You develop a rash. SEEK IMMEDIATE MEDICAL CARE IF:   You have difficulty breathing.  You have chest pain.  You have any allergic problems.   This information is not intended to replace advice given to you by your health care provider. Make sure you discuss any questions you have with your health care provider.   Document Released: 04/08/2000 Document Revised: 01/21/2014 Document Reviewed: 05/01/2011 Elsevier Interactive Patient Education [employer]

## 2015-03-29 NOTE — Transfer of Care (Signed)
Immediate Anesthesia Transfer of Care Note  Patient: Ebony Haney  Procedure(s) Performed: Procedure(s): DIAGNOSTIC LAPAROSCOPY WITH REMOVAL OF ECTOPIC PREGNANCY (N/A)  Patient Location: PACU  Anesthesia Type:General  Level of Consciousness: awake, alert , oriented and patient cooperative  Airway & Oxygen Therapy: Patient Spontanous Breathing and Patient connected to nasal cannula oxygen  Post-op Assessment: Report given to RN, Post -op Vital signs reviewed and stable and Patient moving all extremities X 4  Post vital signs: Reviewed and stable  Last Vitals:  Filed Vitals:   03/28/15 2044 03/28/15 2200  BP: 129/71 123/77  Pulse: 87 69  Temp: 36.7 C 36.9 C  Resp:  18    Complications: No apparent anesthesia complications

## 2015-03-31 ENCOUNTER — Encounter (HOSPITAL_COMMUNITY): Payer: Self-pay | Admitting: Obstetrics & Gynecology

## 2015-04-17 ENCOUNTER — Encounter: Payer: Self-pay | Admitting: Obstetrics & Gynecology

## 2015-04-20 ENCOUNTER — Telehealth: Payer: Self-pay

## 2015-04-20 NOTE — Telephone Encounter (Signed)
Attempted to reach patient to inquire about her missed follow up appointment. Her phone is not accepting calls at this time. Armandina StammerJennifer Howard RN BSN

## 2016-01-02 ENCOUNTER — Emergency Department (HOSPITAL_BASED_OUTPATIENT_CLINIC_OR_DEPARTMENT_OTHER)
Admission: EM | Admit: 2016-01-02 | Discharge: 2016-01-02 | Disposition: A | Discharge status: Home / Self Care | Payer: Self-pay | Attending: Emergency Medicine | Admitting: Emergency Medicine

## 2016-01-02 ENCOUNTER — Encounter (HOSPITAL_BASED_OUTPATIENT_CLINIC_OR_DEPARTMENT_OTHER): Payer: Self-pay | Admitting: *Deleted

## 2016-01-02 DIAGNOSIS — F17210 Nicotine dependence, cigarettes, uncomplicated: Secondary | ICD-10-CM | POA: Insufficient documentation

## 2016-01-02 DIAGNOSIS — K2900 Acute gastritis without bleeding: Secondary | ICD-10-CM | POA: Insufficient documentation

## 2016-01-02 DIAGNOSIS — N3000 Acute cystitis without hematuria: Secondary | ICD-10-CM | POA: Insufficient documentation

## 2016-01-02 LAB — URINALYSIS, ROUTINE W REFLEX MICROSCOPIC
Bilirubin Urine: NEGATIVE
GLUCOSE, UA: NEGATIVE mg/dL
HGB URINE DIPSTICK: NEGATIVE
KETONES UR: NEGATIVE mg/dL
Nitrite: POSITIVE — AB
PROTEIN: NEGATIVE mg/dL
Specific Gravity, Urine: 1.023 (ref 1.005–1.030)
pH: 6.5 (ref 5.0–8.0)

## 2016-01-02 LAB — CBC WITH DIFFERENTIAL/PLATELET
BASOS ABS: 0 10*3/uL (ref 0.0–0.1)
BASOS PCT: 0 %
EOS PCT: 0 %
Eosinophils Absolute: 0 10*3/uL (ref 0.0–0.7)
HCT: 35.8 % — ABNORMAL LOW (ref 36.0–46.0)
Hemoglobin: 11.6 g/dL — ABNORMAL LOW (ref 12.0–15.0)
Lymphocytes Relative: 13 %
Lymphs Abs: 1.2 10*3/uL (ref 0.7–4.0)
MCH: 28.3 pg (ref 26.0–34.0)
MCHC: 32.4 g/dL (ref 30.0–36.0)
MCV: 87.3 fL (ref 78.0–100.0)
MONO ABS: 0.5 10*3/uL (ref 0.1–1.0)
Monocytes Relative: 6 %
NEUTROS ABS: 7.6 10*3/uL (ref 1.7–7.7)
Neutrophils Relative %: 81 %
PLATELETS: 222 10*3/uL (ref 150–400)
RBC: 4.1 MIL/uL (ref 3.87–5.11)
RDW: 13 % (ref 11.5–15.5)
WBC: 9.4 10*3/uL (ref 4.0–10.5)

## 2016-01-02 LAB — COMPREHENSIVE METABOLIC PANEL
ALBUMIN: 4.1 g/dL (ref 3.5–5.0)
ALT: 12 U/L — ABNORMAL LOW (ref 14–54)
AST: 20 U/L (ref 15–41)
Alkaline Phosphatase: 54 U/L (ref 38–126)
Anion gap: 5 (ref 5–15)
BUN: 10 mg/dL (ref 6–20)
CHLORIDE: 107 mmol/L (ref 101–111)
CO2: 24 mmol/L (ref 22–32)
Calcium: 8.6 mg/dL — ABNORMAL LOW (ref 8.9–10.3)
Creatinine, Ser: 0.73 mg/dL (ref 0.44–1.00)
GFR calc Af Amer: >60 mL/min (ref >60)
GFR calc non Af Amer: >60 mL/min (ref >60)
GLUCOSE: 124 mg/dL — AB (ref 65–99)
POTASSIUM: 3.7 mmol/L (ref 3.5–5.1)
Sodium: 136 mmol/L (ref 135–145)
Total Bilirubin: 0.6 mg/dL (ref 0.3–1.2)
Total Protein: 8.2 g/dL — ABNORMAL HIGH (ref 6.5–8.1)

## 2016-01-02 LAB — PREGNANCY, URINE: PREG TEST UR: NEGATIVE

## 2016-01-02 LAB — URINALYSIS, MICROSCOPIC (REFLEX)

## 2016-01-02 MED ORDER — DICYCLOMINE HCL 10 MG PO CAPS
20.0000 mg | ORAL_CAPSULE | Freq: Once | ORAL | Status: AC
  Administered 2016-01-02: 20 mg via ORAL
  Filled 2016-01-02: qty 2

## 2016-01-02 MED ORDER — CEPHALEXIN 500 MG PO CAPS: 500.0000 mg | ORAL_CAPSULE | Freq: Two times a day (BID) | ORAL | 0 refills | Status: DC

## 2016-01-02 MED ORDER — CEPHALEXIN 250 MG PO CAPS
500.0000 mg | ORAL_CAPSULE | Freq: Once | ORAL | Status: AC
  Administered 2016-01-02: 500 mg via ORAL
  Filled 2016-01-02: qty 2

## 2016-01-02 MED ORDER — RANITIDINE HCL 150 MG/10ML PO SYRP
300.0000 mg | ORAL_SOLUTION | Freq: Once | ORAL | Status: AC
  Administered 2016-01-02: 300 mg via ORAL
  Filled 2016-01-02: qty 20

## 2016-01-02 NOTE — ED Provider Notes (Signed)
[facility]   CSN: [id number] Arrival date & time: 01/02/16  0139     History   Chief Complaint Chief Complaint  Patient presents with  . Abdominal Pain    HPI Ebony Haney is a 30 y.o. female with no significant past medical history presenting today for epigastric abdominal pain. This began last night with sudden onset. She states she did have chicken and a soda tonight. She denies any bad food. She has no other pain or symptoms. She cannot describe the pain but states it's in her midepigastric area with no radiation. She has no vomiting or diarrhea. She has no fevers. She denies any urinary symptoms but does say that her urine has been darker than normal. She denies sick contacts. There are no further complaints.  10 Systems reviewed and are negative for acute change except as noted in the HPI.    HPI  Past Medical History:  Diagnosis Date  . Ruptured left tubal ectopic pregnancy causing hemoperitoneum 03/28/2015    Patient Active Problem List   Diagnosis Date Noted  . Ruptured left tubal ectopic pregnancy causing hemoperitoneum 03/28/2015    Past Surgical History:  Procedure Laterality Date  . DIAGNOSTIC LAPAROSCOPY WITH REMOVAL OF ECTOPIC PREGNANCY N/A 03/28/2015   Procedure: DIAGNOSTIC LAPAROSCOPY WITH REMOVAL OF ECTOPIC PREGNANCY;  Surgeon: Tereso NewcomerUgonna A Anyanwu, MD;  Location: [facility];  Service: Gynecology;  Laterality: N/A;    OB History    Gravida Para Term Preterm AB Living   5 4 4  0 0 4   SAB TAB Ectopic Multiple Live Births   0 0 0 0         Home Medications    Prior to Admission medications   Not on File    Family History No family history on file.  Social History Social History  Substance Use Topics  . Smoking status: Current Some Day Smoker    Packs/day: 0.50    Types: Cigarettes  . Smokeless tobacco: Never Used  . Alcohol use No     Allergies   Patient has no known allergies.   Review of Systems Review of  Systems   Physical Exam Updated Vital Signs BP 114/77 (BP Location: Left Arm)   Pulse 86   Temp 98.5 F (36.9 C) (Oral)   Resp 16   Ht 5\' 6"  (1.676 m)   Wt 150 lb (68 kg)   LMP 02/19/2015 (Exact Date)   SpO2 98%   BMI 24.21 kg/m   Physical Exam  Constitutional: She is oriented to person, place, and time. She appears well-developed and well-nourished. No distress.  HENT:  Head: Normocephalic and atraumatic.  Nose: Nose normal.  Mouth/Throat: Oropharynx is clear and moist. No oropharyngeal exudate.  Eyes: Conjunctivae and EOM are normal. Pupils are equal, round, and reactive to light. No scleral icterus.  Neck: Normal range of motion. Neck supple. No JVD present. No tracheal deviation present. No thyromegaly present.  Cardiovascular: Normal rate, regular rhythm and normal heart sounds.  Exam reveals no gallop and no friction rub.   No murmur heard. Pulmonary/Chest: Effort normal and breath sounds normal. No respiratory distress. She has no wheezes. She exhibits no tenderness.  Abdominal: Soft. Bowel sounds are normal. She exhibits no distension and no mass. There is tenderness. There is no rebound and no guarding.  Mild midepigastric tenderness to palpation.  Musculoskeletal: Normal range of motion. She exhibits no edema or tenderness.  Lymphadenopathy:    She has no cervical  adenopathy.  Neurological: She is alert and oriented to person, place, and time. No cranial nerve deficit. She exhibits normal muscle tone.  Skin: Skin is warm and dry. No rash noted. No erythema. No pallor.  Nursing note and vitals reviewed.    ED Treatments / Results  Labs (all labs ordered are listed, but only abnormal results are displayed) Labs Reviewed  URINALYSIS, ROUTINE W REFLEX MICROSCOPIC - Abnormal; Notable for the following:       Result Value   APPearance CLOUDY (*)    Nitrite POSITIVE (*)    Leukocytes, UA LARGE (*)    All other components within normal limits  CBC WITH  DIFFERENTIAL/PLATELET - Abnormal; Notable for the following:    Hemoglobin 11.6 (*)    HCT 35.8 (*)    All other components within normal limits  COMPREHENSIVE METABOLIC PANEL - Abnormal; Notable for the following:    Glucose, Bld 124 (*)    Calcium 8.6 (*)    Total Protein 8.2 (*)    ALT 12 (*)    All other components within normal limits  URINALYSIS, MICROSCOPIC (REFLEX) - Abnormal; Notable for the following:    Bacteria, UA MANY (*)    Squamous Epithelial / LPF 0-5 (*)    All other components within normal limits  PREGNANCY, URINE    EKG  EKG Interpretation None       Radiology No results found.  Procedures Procedures (including critical care time)  Medications Ordered in ED Medications  dicyclomine (BENTYL) capsule 20 mg (not administered)  cephALEXin (KEFLEX) capsule 500 mg (not administered)  ranitidine (ZANTAC) 150 MG/10ML syrup 300 mg (not administered)     Initial Impression / Assessment and Plan / ED Course  I have reviewed the triage vital signs and the nursing notes.  Pertinent labs & imaging results that were available during my care of the patient were reviewed by me and considered in my medical decision making (see chart for details).  Clinical Course     Patient presents to emergency department for abdominal pain. Her pain is mid epigastric, possibly gastritis. Laboratory studies are unremarkable. Urinalysis reveals a UTI but she denies these symptoms. She states she's had them in the past and is unsure if this is how it felt. She does admit to her urine being darker. We'll treat in the setting with Keflex. She was also given Bentyl and Zantac for her midepigastric abdominal pain. Primary care follow up advised in 3 days. We'll discharge home with prescription of Keflex to take for treatment. She appears well in no acute distress, vital signs were within her normal limits and she is safe for discharge.  Final Clinical Impressions(s) / ED Diagnoses    Final diagnoses:  None    New Prescriptions New Prescriptions   No medications on file     Tomasita CrumbleAdeleke Haeleigh Streiff, MD 01/02/16 [phone]

## 2016-01-02 NOTE — ED Triage Notes (Signed)
Pt with upper abd pain that started 2 hours PTA. Denies fever N/V/D denies urinary sx vaginal bleeding or DC.

## 2016-01-31 ENCOUNTER — Encounter (HOSPITAL_COMMUNITY): Payer: Self-pay

## 2016-05-04 ENCOUNTER — Encounter (HOSPITAL_BASED_OUTPATIENT_CLINIC_OR_DEPARTMENT_OTHER): Payer: Self-pay | Admitting: *Deleted

## 2016-05-04 ENCOUNTER — Emergency Department (HOSPITAL_BASED_OUTPATIENT_CLINIC_OR_DEPARTMENT_OTHER)
Admission: EM | Admit: 2016-05-04 | Discharge: 2016-05-04 | Disposition: A | Discharge status: Home / Self Care | Payer: Self-pay | Attending: Emergency Medicine | Admitting: Emergency Medicine

## 2016-05-04 DIAGNOSIS — N6320 Unspecified lump in the left breast, unspecified quadrant: Secondary | ICD-10-CM | POA: Insufficient documentation

## 2016-05-04 DIAGNOSIS — F17210 Nicotine dependence, cigarettes, uncomplicated: Secondary | ICD-10-CM | POA: Insufficient documentation

## 2016-05-04 MED ORDER — IBUPROFEN 800 MG PO TABS
800.0000 mg | ORAL_TABLET | Freq: Once | ORAL | Status: AC
  Administered 2016-05-04: 800 mg via ORAL
  Filled 2016-05-04: qty 1

## 2016-05-04 NOTE — ED Triage Notes (Signed)
Pt noticed a lump in left breast x 2 days. Denies redness drainage fevers

## 2016-05-04 NOTE — ED Notes (Signed)
Pt verbalizes understanding of d/c instructions and denies any further needs at this time. 

## 2016-05-04 NOTE — ED Provider Notes (Signed)
   [facility] Provider Note: Lowella Dell, MD, FACEP  CSN: [id number] MRN: [id number] ARRIVAL: 05/04/16 at 0015 ROOM: [facility]   CHIEF COMPLAINT  Breast Mass   HISTORY OF PRESENT ILLNESS  Ebony Haney is a 31 y.o. female who discovered a lump in her left breast about a day ago. The lump is located low her left nipple at about the 8:00 position. There is no significant associated pain or tenderness. It is not red or warm. There is no nipple drainage. She has noticed no associated lymphadenopathy.   Past Medical History:  Diagnosis Date  . Ruptured left tubal ectopic pregnancy causing hemoperitoneum 03/28/2015    Past Surgical History:  Procedure Laterality Date  . DIAGNOSTIC LAPAROSCOPY WITH REMOVAL OF ECTOPIC PREGNANCY N/A 03/28/2015   Procedure: DIAGNOSTIC LAPAROSCOPY WITH REMOVAL OF ECTOPIC PREGNANCY;  Surgeon: Tereso Newcomer, MD;  Location: [facility];  Service: Gynecology;  Laterality: N/A;    History reviewed. No pertinent family history.  Social History  Substance Use Topics  . Smoking status: Current Some Day Smoker    Packs/day: 0.50    Types: Cigarettes  . Smokeless tobacco: Never Used  . Alcohol use No    Prior to Admission medications   Not on File    Allergies Patient has no known allergies.   REVIEW OF SYSTEMS  Negative except as noted here or in the History of Present Illness.   PHYSICAL EXAMINATION  Initial Vital Signs Blood pressure 121/87, pulse 73, temperature 98.3 F (36.8 C), temperature source Oral, resp. rate 18, height  (1.651 m), weight 190 lb (86.2 kg), last menstrual period 04/09/2016, SpO2 100 %, unknown if currently breastfeeding.  Examination General: Well-developed, well-nourished female in no acute distress; appearance consistent with age of record HENT: normocephalic; atraumatic Eyes: Normal appearance Neck: supple Heart: regular rate and rhythm Lungs: clear to auscultation bilaterally Chest: Tanner 5  breasts, symmetric appearing; poorly defined nontender mass palpable below left nipple at about the 8:00 position; no nipple discharge; no axillary lymphadenopathy Abdomen: soft; nondistended; nontender; bowel sounds present Extremities: No deformity; full range of motion Neurologic: Awake, alert and oriented; motor function intact in all extremities and symmetric; no facial droop Skin: Warm and dry Psychiatric: Normal mood and affect   RESULTS  Summary of this visit's results, reviewed by myself:   EKG Interpretation  Date/Time:    Ventricular Rate:    PR Interval:    QRS Duration:   QT Interval:    QTC Calculation:   R Axis:     Text Interpretation:        Laboratory Studies: No results found for this or any previous visit (from the past 24 hour(s)). Imaging Studies: No results found.  ED COURSE  Nursing notes and initial vitals signs, including pulse oximetry, reviewed.  Vitals:   05/04/16 0034 05/04/16 0035 05/04/16 0215 05/04/16 0403  BP:  138/85 121/87 119/74  Pulse:  88 73 65  Resp:  Temp:  98.3 F (36.8 C)    TempSrc:  Oral    SpO2:  100% 100% 100%  Weight: 190 lb (86.2 kg)     Height:  (1.651 m)      Will refer to [facility] for further diagnostic workup.  PROCEDURES    ED DIAGNOSES     ICD-9-CM ICD-10-CM   1. Left breast mass 611.72 N63.20        Paula Libra, MD 05/04/16 [phone]

## 2016-07-28 ENCOUNTER — Emergency Department (HOSPITAL_BASED_OUTPATIENT_CLINIC_OR_DEPARTMENT_OTHER)
Admission: EM | Admit: 2016-07-28 | Discharge: 2016-07-28 | Disposition: A | Discharge status: Home / Self Care | Payer: Self-pay | Attending: Emergency Medicine | Admitting: Emergency Medicine

## 2016-07-28 ENCOUNTER — Encounter (HOSPITAL_BASED_OUTPATIENT_CLINIC_OR_DEPARTMENT_OTHER): Payer: Self-pay | Admitting: Emergency Medicine

## 2016-07-28 DIAGNOSIS — Y929 Unspecified place or not applicable: Secondary | ICD-10-CM | POA: Insufficient documentation

## 2016-07-28 DIAGNOSIS — Z23 Encounter for immunization: Secondary | ICD-10-CM | POA: Insufficient documentation

## 2016-07-28 DIAGNOSIS — Y939 Activity, unspecified: Secondary | ICD-10-CM | POA: Insufficient documentation

## 2016-07-28 DIAGNOSIS — Y999 Unspecified external cause status: Secondary | ICD-10-CM | POA: Insufficient documentation

## 2016-07-28 DIAGNOSIS — S91331A Puncture wound without foreign body, right foot, initial encounter: Secondary | ICD-10-CM | POA: Insufficient documentation

## 2016-07-28 DIAGNOSIS — X58XXXA Exposure to other specified factors, initial encounter: Secondary | ICD-10-CM | POA: Insufficient documentation

## 2016-07-28 DIAGNOSIS — F17210 Nicotine dependence, cigarettes, uncomplicated: Secondary | ICD-10-CM | POA: Insufficient documentation

## 2016-07-28 MED ORDER — SULFAMETHOXAZOLE-TRIMETHOPRIM 800-160 MG PO TABS: 1.0000 | ORAL_TABLET | Freq: Two times a day (BID) | ORAL | 0 refills | Status: DC

## 2016-07-28 MED ORDER — IBUPROFEN 800 MG PO TABS: 800.0000 mg | ORAL_TABLET | Freq: Three times a day (TID) | ORAL | 0 refills | Status: DC

## 2016-07-28 MED ORDER — SULFAMETHOXAZOLE-TRIMETHOPRIM 800-160 MG PO TABS: 1.0000 | ORAL_TABLET | Freq: Two times a day (BID) | ORAL | 0 refills | Status: AC

## 2016-07-28 MED ORDER — SULFAMETHOXAZOLE-TRIMETHOPRIM 800-160 MG PO TABS
1.0000 | ORAL_TABLET | Freq: Once | ORAL | Status: AC
  Administered 2016-07-28: 1 via ORAL
  Filled 2016-07-28: qty 1

## 2016-07-28 MED ORDER — TETANUS-DIPHTH-ACELL PERTUSSIS 5-2.5-18.5 LF-MCG/0.5 IM SUSP
0.5000 mL | Freq: Once | INTRAMUSCULAR | Status: AC
  Administered 2016-07-28: 0.5 mL via INTRAMUSCULAR
  Filled 2016-07-28: qty 0.5

## 2016-07-28 NOTE — ED Notes (Signed)
PMS intact before and after. Pt tolerated well. All questions answered. 

## 2016-07-28 NOTE — ED Triage Notes (Signed)
Pt pulled rusty nail from bottom of her R foot ~ 1900 yesterday evening. Pt ambulatory.

## 2016-07-28 NOTE — ED Provider Notes (Signed)
[facility] Provider [facility]   CSN: [id number] Arrival date & time: 07/28/16  0148     History   Chief Complaint Chief Complaint  Patient presents with  . Puncture Wound    HPI Ebony Haney is a 31 y.o. female.  Patient presents to the ER for evaluation of foot injury. Patient reports that she stepped on a rusty nail yesterday evening. Patient complaining of persistent pain in the foot since the episode occurred. There was bleeding initially, but it stopped after a couple of minutes of direct pressure.      Past Medical History:  Diagnosis Date  . Ruptured left tubal ectopic pregnancy causing hemoperitoneum 03/28/2015    Patient Active Problem List   Diagnosis Date Noted  . Ruptured left tubal ectopic pregnancy causing hemoperitoneum 03/28/2015    Past Surgical History:  Procedure Laterality Date  . DIAGNOSTIC LAPAROSCOPY WITH REMOVAL OF ECTOPIC PREGNANCY N/A 03/28/2015   Procedure: DIAGNOSTIC LAPAROSCOPY WITH REMOVAL OF ECTOPIC PREGNANCY;  Surgeon: Tereso NewcomerUgonna A Anyanwu, MD;  Location: [facility];  Service: Gynecology;  Laterality: N/A;    OB History    Gravida Para Term Preterm AB Living   5 4 4  0 0 4   SAB TAB Ectopic Multiple Live Births   0 0 0 0         Home Medications    Prior to Admission medications   Medication Sig Start Date End Date Taking? Authorizing Provider  ibuprofen (ADVIL,MOTRIN) 800 MG tablet Take 1 tablet (800 mg total) by mouth 3 (three) times daily. 07/28/16   Gilda CreasePollina, Christopher J, MD  sulfamethoxazole-trimethoprim (BACTRIM DS,SEPTRA DS) 800-160 MG tablet Take 1 tablet by mouth 2 (two) times daily. 07/28/16 08/04/16  Gilda CreasePollina, Christopher J, MD    Family History No family history on file.  Social History Social History  Substance Use Topics  . Smoking status: Current Some Day Smoker    Packs/day: 0.50    Types: Cigarettes  . Smokeless tobacco: Never Used  . Alcohol use No     Allergies   Patient has no known  allergies.   Review of Systems Review of Systems  Constitutional: Negative for fever.  Skin: Positive for wound.     Physical Exam Updated Vital Signs BP 117/79 (BP Location: Right Arm)   Pulse 79   Temp 99.7 F (37.6 C) (Oral)   Resp 17   Ht 5\' 9"  (1.753 m)   Wt 87.5 kg (193 lb)   LMP 07/09/2016   SpO2 99%   BMI 28.50 kg/m   Physical Exam  Constitutional: She is oriented to person, place, and time. She appears well-developed and well-nourished.  HENT:  Head: Atraumatic.  Eyes: EOM are normal.  Neck: Normal range of motion.  Pulmonary/Chest: Effort normal.  Musculoskeletal:       Feet:  Neurological: She is alert and oriented to person, place, and time.  Skin:        ED Treatments / Results  Labs (all labs ordered are listed, but only abnormal results are displayed) Labs Reviewed - No data to display  EKG  EKG Interpretation None       Radiology No results found.  Procedures Procedures (including critical care time)  Medications Ordered in ED Medications  Tdap (BOOSTRIX) injection 0.5 mL (not administered)  sulfamethoxazole-trimethoprim (BACTRIM DS,SEPTRA DS) 800-160 MG per tablet 1 tablet (not administered)     Initial Impression / Assessment and Plan / ED Course  I have reviewed the triage vital signs  and the nursing notes.  Pertinent labs & imaging results that were available during my care of the patient were reviewed by me and considered in my medical decision making (see chart for details).     Patient presents with puncture wound to the right foot. She stepped on a rusty nail. Tetanus was updated. He did go through her shoe into her foot. She immediately cleaned and soaked it. No obvious foreign bodies noted, but discussed with patient cannot rule out foreign body. Patient given return precautions for signs of infection. Was given empiric Bactrim.  Final Clinical Impressions(s) / ED Diagnoses   Final diagnoses:  Puncture wound of  plantar aspect of right foot, initial encounter    New Prescriptions New Prescriptions   IBUPROFEN (ADVIL,MOTRIN) 800 MG TABLET    Take 1 tablet (800 mg total) by mouth 3 (three) times daily.   SULFAMETHOXAZOLE-TRIMETHOPRIM (BACTRIM DS,SEPTRA DS) 800-160 MG TABLET    Take 1 tablet by mouth 2 (two) times daily.     Gilda Crease, MD 07/28/16 [phone]

## 2016-10-24 IMAGING — US US OB COMP LESS 14 WK
1 series · 13 of 28 positions shown · non-contrast
Comparison: None.

CLINICAL DATA: Left lower quadrant pain and spotting for 1 day,
estimated gestational age of 1 week 3 days, quantitative beta HCG
not available

EXAM:
OBSTETRIC <14 WK US AND TRANSVAGINAL OB US
TECHNIQUE: Both transabdominal and transvaginal ultrasound examinations were
performed for complete evaluation of the gestation as well as the
maternal uterus, adnexal regions, and pelvic cul-de-sac.
Transvaginal technique was performed to assess early pregnancy.

[Series 1: us ob comp less 14 wk · 0.20mm/px · 13 of 70 slices shown]
[im 3/70]
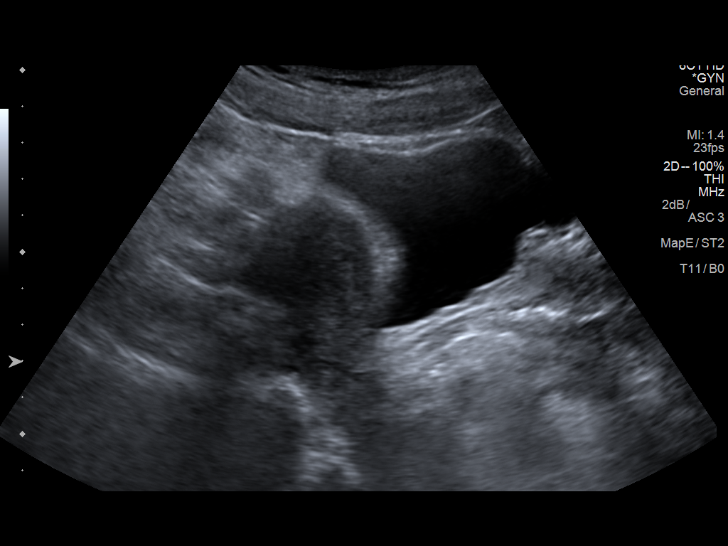
[im 8/70]
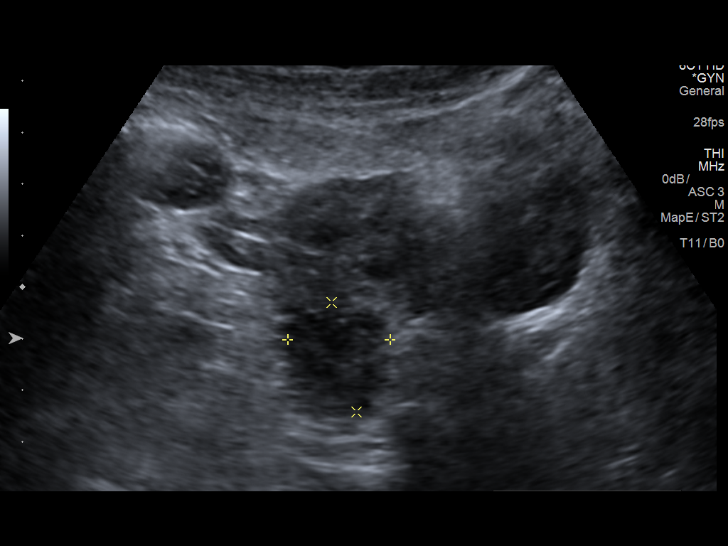
[im 13/70]
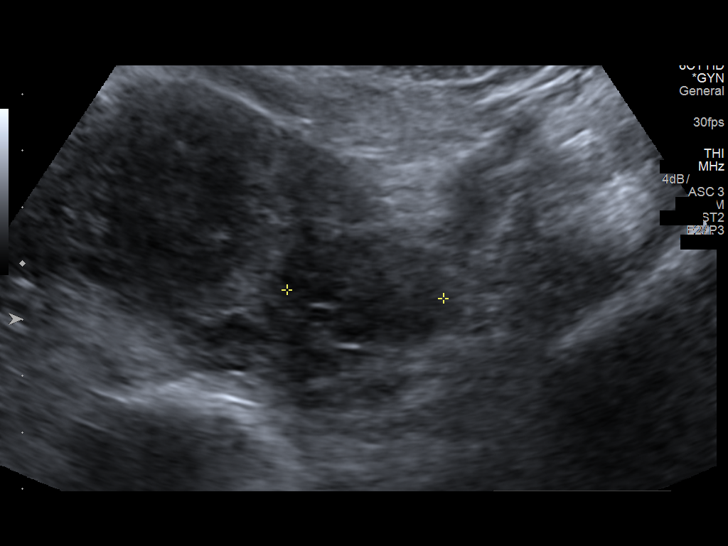
[im 18/70]
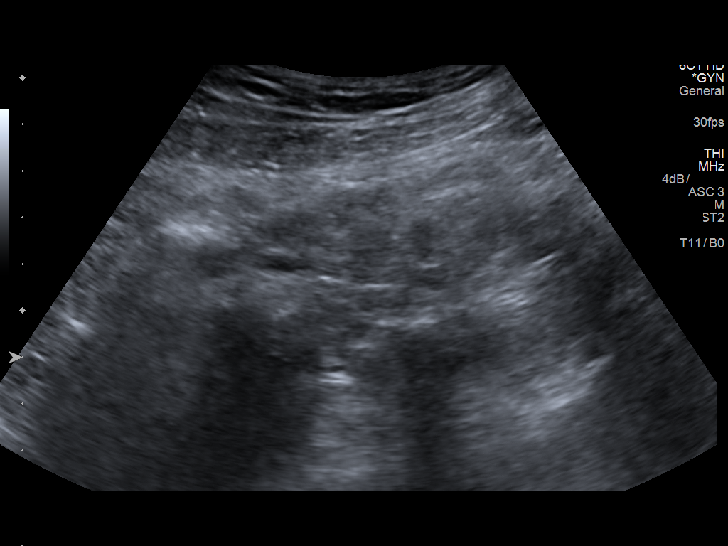
[im 24/70]
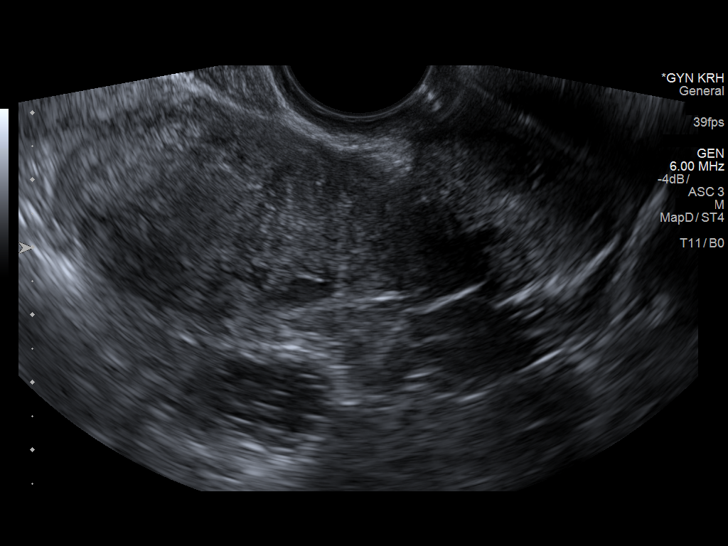
[im 29/70]
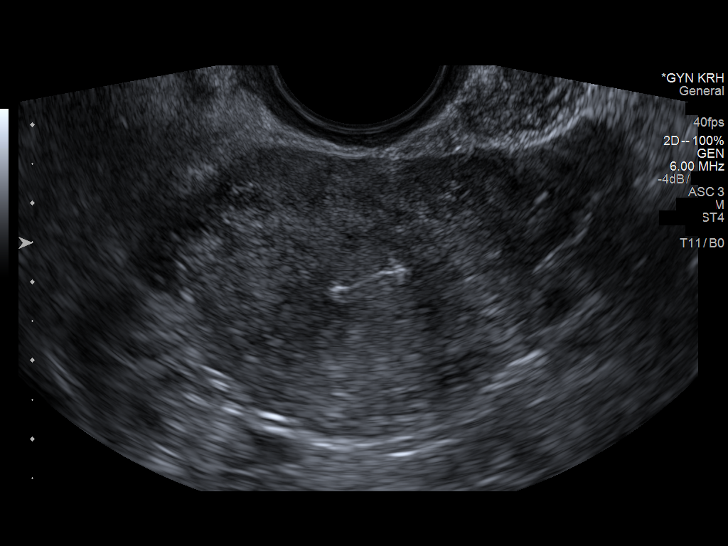
[im 36/70]
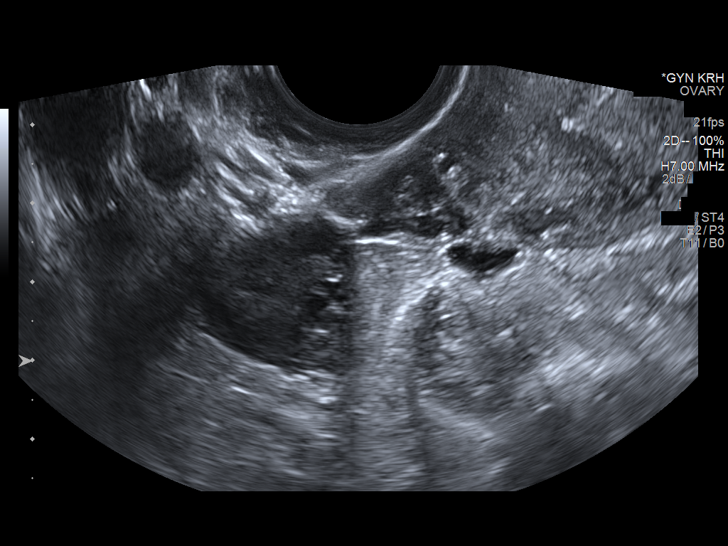
[im 41/70]
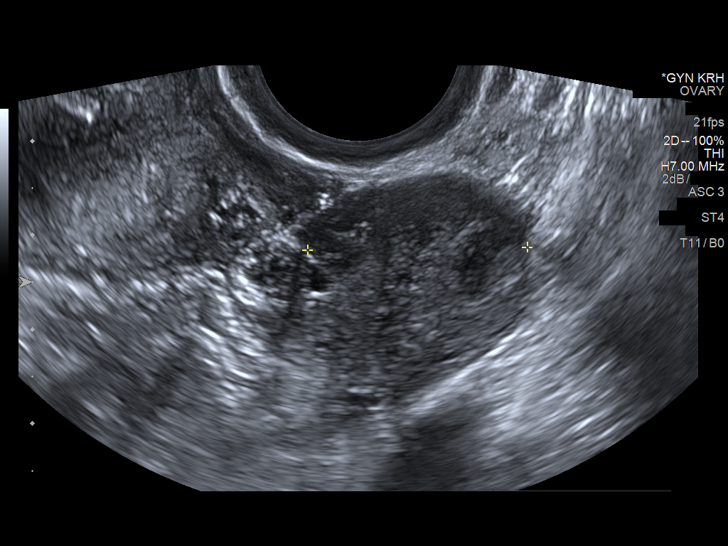
[im 47/70]
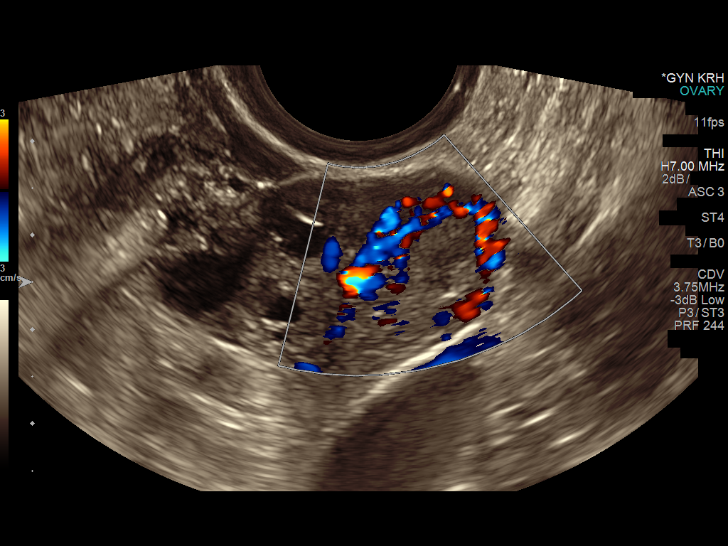
[im 52/70]
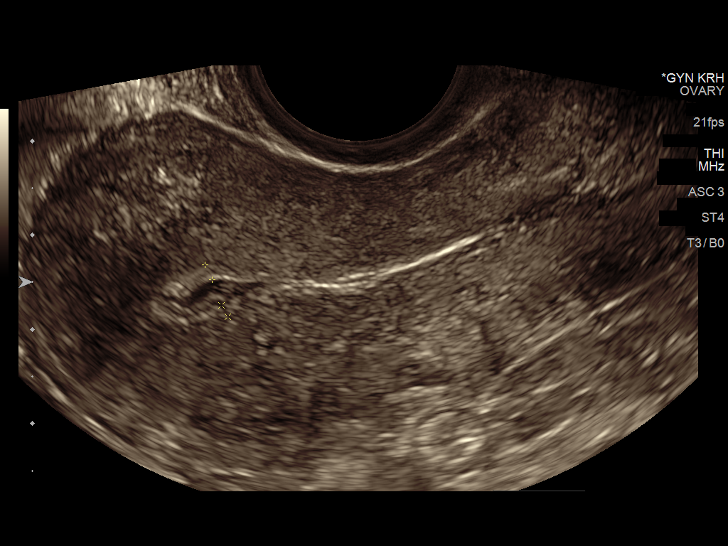
[im 57/70]
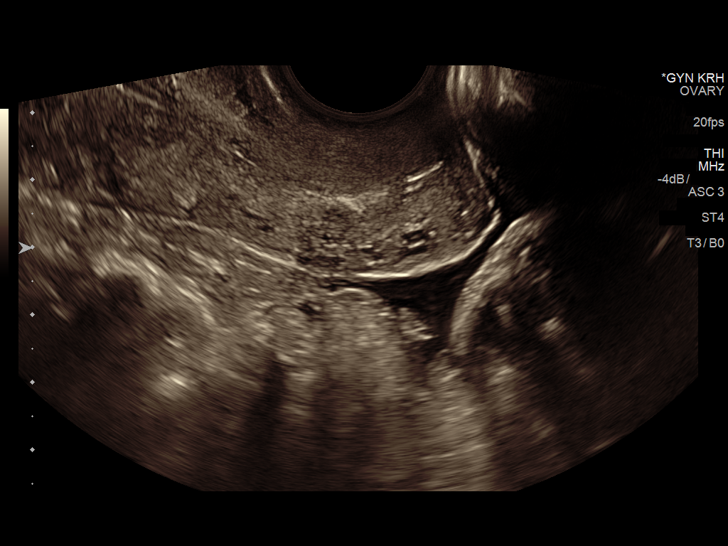
[im 62/70]
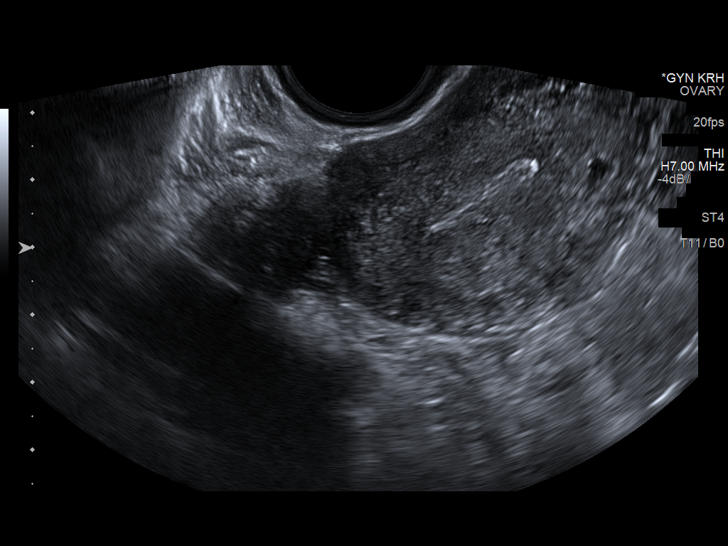
[im 67/70]
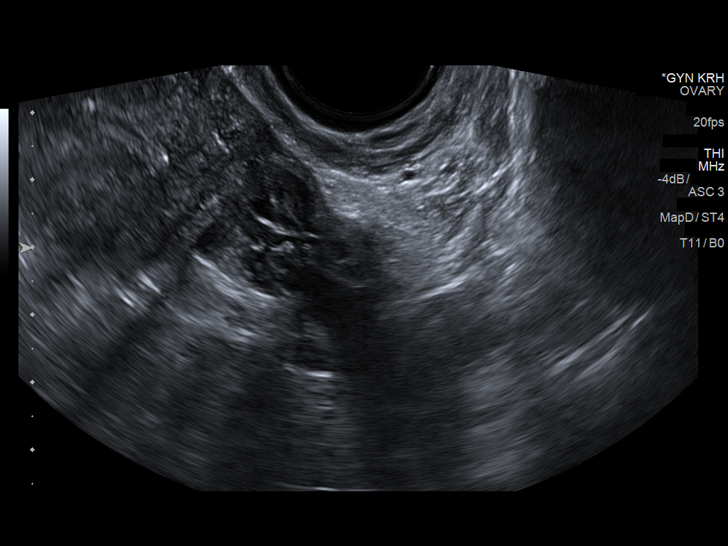

[13 of 28 positions shown; findings below may reference images not displayed]

FINDINGS: Intrauterine gestational sac: None visualized

Yolk sac:  None

Embryo:  None

Cardiac Activity: None

Maternal uterus/adnexae: The endometrium measures about 3 mm. There
is a small amount of fluid with debris in the endometrial canal. The
right ovary is normal. The left ovary demonstrates a 12 x 11 x 13 mm
mildly heterogeneous nodular area with peripheral enhancement.

There is a small volume of free fluid in the pelvis.
IMPRESSION: No evidence of intrauterine gestation currently. No specific
evidence of ectopic pregnancy. Possible early corpus luteum left
ovary.

Nonspecific small volume free fluid in the pelvis. Differential
diagnostic possibilities include intrauterine gestation that is too
early to image, spontaneous abortion, or nonvisualized ectopic
pregnancy.

Close clinical and sonographic follow-up recommended.

## 2017-01-09 ENCOUNTER — Other Ambulatory Visit: Payer: Self-pay

## 2017-01-09 ENCOUNTER — Emergency Department (HOSPITAL_BASED_OUTPATIENT_CLINIC_OR_DEPARTMENT_OTHER)
Admission: EM | Admit: 2017-01-09 | Discharge: 2017-01-10 | Disposition: A | Discharge status: Home / Self Care | Payer: Medicaid Other | Attending: Emergency Medicine | Admitting: Emergency Medicine

## 2017-01-09 ENCOUNTER — Emergency Department (HOSPITAL_BASED_OUTPATIENT_CLINIC_OR_DEPARTMENT_OTHER): Payer: Medicaid Other

## 2017-01-09 ENCOUNTER — Encounter (HOSPITAL_BASED_OUTPATIENT_CLINIC_OR_DEPARTMENT_OTHER): Payer: Self-pay | Admitting: *Deleted

## 2017-01-09 DIAGNOSIS — O9989 Other specified diseases and conditions complicating pregnancy, childbirth and the puerperium: Secondary | ICD-10-CM | POA: Diagnosis present

## 2017-01-09 DIAGNOSIS — F17210 Nicotine dependence, cigarettes, uncomplicated: Secondary | ICD-10-CM | POA: Insufficient documentation

## 2017-01-09 DIAGNOSIS — O26899 Other specified pregnancy related conditions, unspecified trimester: Secondary | ICD-10-CM

## 2017-01-09 DIAGNOSIS — R102 Pelvic and perineal pain: Secondary | ICD-10-CM | POA: Insufficient documentation

## 2017-01-09 DIAGNOSIS — Z8759 Personal history of other complications of pregnancy, childbirth and the puerperium: Secondary | ICD-10-CM | POA: Diagnosis not present

## 2017-01-09 DIAGNOSIS — Z3A09 9 weeks gestation of pregnancy: Secondary | ICD-10-CM | POA: Insufficient documentation

## 2017-01-09 DIAGNOSIS — N898 Other specified noninflammatory disorders of vagina: Secondary | ICD-10-CM | POA: Diagnosis not present

## 2017-01-09 DIAGNOSIS — R109 Unspecified abdominal pain: Secondary | ICD-10-CM

## 2017-01-09 LAB — COMPREHENSIVE METABOLIC PANEL
ALBUMIN: 3.8 g/dL (ref 3.5–5.0)
ALK PHOS: 51 U/L (ref 38–126)
ALT: 13 U/L — AB (ref 14–54)
ANION GAP: 8 (ref 5–15)
AST: 12 U/L — ABNORMAL LOW (ref 15–41)
BUN: 16 mg/dL (ref 6–20)
CALCIUM: 9.1 mg/dL (ref 8.9–10.3)
CHLORIDE: 102 mmol/L (ref 101–111)
CO2: 24 mmol/L (ref 22–32)
Creatinine, Ser: 0.58 mg/dL (ref 0.44–1.00)
GFR calc Af Amer: >60 mL/min (ref >60)
GFR calc non Af Amer: >60 mL/min (ref >60)
GLUCOSE: 98 mg/dL (ref 65–99)
Potassium: 3.6 mmol/L (ref 3.5–5.1)
SODIUM: 134 mmol/L — AB (ref 135–145)
Total Bilirubin: 0.3 mg/dL (ref 0.3–1.2)
Total Protein: 8.1 g/dL (ref 6.5–8.1)

## 2017-01-09 LAB — URINALYSIS, ROUTINE W REFLEX MICROSCOPIC
BILIRUBIN URINE: NEGATIVE
Glucose, UA: NEGATIVE mg/dL
HGB URINE DIPSTICK: NEGATIVE
KETONES UR: 15 mg/dL — AB
Leukocytes, UA: NEGATIVE
NITRITE: NEGATIVE
PROTEIN: NEGATIVE mg/dL
SPECIFIC GRAVITY, URINE: 1.025 (ref 1.005–1.030)
pH: 6 (ref 5.0–8.0)

## 2017-01-09 LAB — CBC WITH DIFFERENTIAL/PLATELET
BASOS ABS: 0 10*3/uL (ref 0.0–0.1)
BASOS PCT: 0 %
EOS ABS: 0.1 10*3/uL (ref 0.0–0.7)
EOS PCT: 1 %
HCT: 35.6 % — ABNORMAL LOW (ref 36.0–46.0)
HEMOGLOBIN: 11.4 g/dL — AB (ref 12.0–15.0)
LYMPHS ABS: 2.6 10*3/uL (ref 0.7–4.0)
Lymphocytes Relative: 32 %
MCH: 28.1 pg (ref 26.0–34.0)
MCHC: 32 g/dL (ref 30.0–36.0)
MCV: 87.7 fL (ref 78.0–100.0)
Monocytes Absolute: 0.7 10*3/uL (ref 0.1–1.0)
Monocytes Relative: 9 %
NEUTROS PCT: 58 %
Neutro Abs: 4.8 10*3/uL (ref 1.7–7.7)
PLATELETS: 252 10*3/uL (ref 150–400)
RBC: 4.06 MIL/uL (ref 3.87–5.11)
RDW: 12.6 % (ref 11.5–15.5)
WBC: 8.2 10*3/uL (ref 4.0–10.5)

## 2017-01-09 LAB — WET PREP, GENITAL
SPERM: NONE SEEN
TRICH WET PREP: NONE SEEN
Yeast Wet Prep HPF POC: NONE SEEN

## 2017-01-09 LAB — HCG, QUANTITATIVE, PREGNANCY: hCG, Beta Chain, Quant, S: 95446 m[IU]/mL — ABNORMAL HIGH (ref <5)

## 2017-01-09 LAB — PREGNANCY, URINE: Preg Test, Ur: POSITIVE — AB

## 2017-01-09 NOTE — ED Provider Notes (Signed)
[facility] Provider Note: Lowella Dell, MD, FACEP  CSN: [id number] MRN: [id number] ARRIVAL: 01/09/17 at 2010 ROOM: [facility]09/[facility]09   CHIEF COMPLAINT  Abdominal Pain   HISTORY OF PRESENT ILLNESS  01/09/17 11:21 PM Ebony Haney is a 31 y.o. female with a 6-day history of intermittent abdominal pain.  She describes the pain is sharp and located in the left suprapubic region.  She rates it as a 6 out of 10 at its worst.  It is somewhat worse with movement or palpation.  She denies associated vaginal bleeding but has had a white discharge and an abnormal vaginal odor.  She believes the odor is due to yeast infection.  She has had some nausea but no vomiting or diarrhea.  She states she took a home pregnancy test which was negative.    Past Medical History:  Diagnosis Date  . Ruptured left tubal ectopic pregnancy causing hemoperitoneum 03/28/2015    Past Surgical History:  Procedure Laterality Date  . DIAGNOSTIC LAPAROSCOPY WITH REMOVAL OF ECTOPIC PREGNANCY N/A 03/28/2015   Procedure: DIAGNOSTIC LAPAROSCOPY WITH REMOVAL OF ECTOPIC PREGNANCY;  Surgeon: Tereso Newcomer, MD;  Location: [facility];  Service: Gynecology;  Laterality: N/A;    No family history on file.  Social History   Tobacco Use  . Smoking status: Current Some Day Smoker    Packs/day: 0.50    Types: Cigarettes  . Smokeless tobacco: Never Used  Substance Use Topics  . Alcohol use: No  . Drug use: No    Prior to Admission medications   Medication Sig Start Date End Date Taking? Authorizing Provider  ibuprofen (ADVIL,MOTRIN) 800 MG tablet Take 1 tablet (800 mg total) by mouth 3 (three) times daily. 07/28/16   Gilda Crease, MD    Allergies Patient has no known allergies.   REVIEW OF SYSTEMS  Negative except as noted here or in the History of Present Illness.   PHYSICAL EXAMINATION  Initial Vital Signs Blood pressure 126/81, pulse 75, temperature 98.2 F (36.8 C), temperature source Oral,  resp. rate 16, height 5\' 9"  (1.753 m), weight 92.5 kg (204 lb), last menstrual period 10/01/2016, SpO2 100 %, unknown if currently breastfeeding.  Examination General: Well-developed, well-nourished female in no acute distress; appearance consistent with age of record HENT: normocephalic; atraumatic Eyes: pupils equal, round and reactive to light; extraocular muscles intact Neck: supple Heart: regular rate and rhythm Lungs: clear to auscultation bilaterally Abdomen: soft; nondistended; mild suprapubic tenderness; no masses or hepatosplenomegaly; bowel sounds present GU: White curd-like vaginal discharge; no vaginal bleeding; no cervical motion tenderness; mild left adnexal tenderness Extremities: No deformity; full range of motion; pulses normal Neurologic: Awake, alert and oriented; motor function intact in all extremities and symmetric; no facial droop Skin: Warm and dry Psychiatric: Normal mood and affect   RESULTS  Summary of this visit's results, reviewed by myself:   EKG Interpretation  Date/Time:    Ventricular Rate:    PR Interval:    QRS Duration:   QT Interval:    QTC Calculation:   R Axis:     Text Interpretation:        Laboratory Studies: Results for orders placed or performed during the hospital encounter of 01/09/17 (from the past 24 hour(s))  Pregnancy, urine     Status: Abnormal   Collection Time: 01/09/17  8:37 PM  Result Value Ref Range   Preg Test, Ur POSITIVE (A) NEGATIVE  Urinalysis, Routine w reflex microscopic     Status:  Abnormal   Collection Time: 01/09/17  8:37 PM  Result Value Ref Range   Color, Urine YELLOW YELLOW   APPearance CLEAR CLEAR   Specific Gravity, Urine 1.025 1.005 - 1.030   pH 6.0 5.0 - 8.0   Glucose, UA NEGATIVE NEGATIVE mg/dL   Hgb urine dipstick NEGATIVE NEGATIVE   Bilirubin Urine NEGATIVE NEGATIVE   Ketones, ur 15 (A) NEGATIVE mg/dL   Protein, ur NEGATIVE NEGATIVE mg/dL   Nitrite NEGATIVE NEGATIVE   Leukocytes, UA  NEGATIVE NEGATIVE  CBC with Differential     Status: Abnormal   Collection Time: 01/09/17 10:09 PM  Result Value Ref Range   WBC 8.2 4.0 - 10.5 K/uL   RBC 4.06 3.87 - 5.11 MIL/uL   Hemoglobin 11.4 (L) 12.0 - 15.0 g/dL   HCT 16.135.6 (L) 09.636.0 - 04.546.0 %   MCV 87.7 78.0 - 100.0 fL   MCH 28.1 26.0 - 34.0 pg   MCHC 32.0 30.0 - 36.0 g/dL   RDW 40.912.6 81.111.5 - 91.415.5 %   Platelets 252 150 - 400 K/uL   Neutrophils Relative % 58 %   Neutro Abs 4.8 1.7 - 7.7 K/uL   Lymphocytes Relative 32 %   Lymphs Abs 2.6 0.7 - 4.0 K/uL   Monocytes Relative 9 %   Monocytes Absolute 0.7 0.1 - 1.0 K/uL   Eosinophils Relative 1 %   Eosinophils Absolute 0.1 0.0 - 0.7 K/uL   Basophils Relative 0 %   Basophils Absolute 0.0 0.0 - 0.1 K/uL  Comprehensive metabolic panel     Status: Abnormal   Collection Time: 01/09/17 10:09 PM  Result Value Ref Range   Sodium 134 (L) 135 - 145 mmol/L   Potassium 3.6 3.5 - 5.1 mmol/L   Chloride 102 101 - 111 mmol/L   CO2 24 22 - 32 mmol/L   Glucose, Bld 98 65 - 99 mg/dL   BUN 16 6 - 20 mg/dL   Creatinine, Ser 7.820.58 0.44 - 1.00 mg/dL   Calcium 9.1 8.9 - 95.610.3 mg/dL   Total Protein 8.1 6.5 - 8.1 g/dL   Albumin 3.8 3.5 - 5.0 g/dL   AST 12 (L) 15 - 41 U/L   ALT 13 (L) 14 - 54 U/L   Alkaline Phosphatase 51 38 - 126 U/L   Total Bilirubin 0.3 0.3 - 1.2 mg/dL   GFR calc non Af Amer >60 >60 mL/min   GFR calc Af Amer >60 >60 mL/min   Anion gap 8 5 - 15  hCG, quantitative, pregnancy     Status: Abnormal   Collection Time: 01/09/17 10:09 PM  Result Value Ref Range   hCG, Beta Chain, Quant, S 95,446 (H) <5 mIU/mL  Wet prep, genital     Status: Abnormal   Collection Time: 01/09/17 11:45 PM  Result Value Ref Range   Yeast Wet Prep HPF POC NONE SEEN NONE SEEN   Trich, Wet Prep NONE SEEN NONE SEEN   Clue Cells Wet Prep HPF POC PRESENT (A) NONE SEEN   WBC, Wet Prep HPF POC MANY (A) NONE SEEN   Sperm NONE SEEN    Imaging Studies: [place] Ob Comp Less 14 Wks  Result Date: 01/09/2017 CLINICAL  DATA:  Pelvic pain for 6 days EXAM: OBSTETRIC <14 WK ULTRASOUND TECHNIQUE: Transabdominal ultrasound was performed for evaluation of the gestation as well as the maternal uterus and adnexal regions. COMPARISON:  None. FINDINGS: Intrauterine gestational sac: Single intrauterine gestation Yolk sac:  Visible Embryo:  Visible Cardiac Activity: Visible  Heart Rate: 169 bpm CRL:   27.2  mm   9 w 4d                  US EDC: 08/10/2017 Subchorionic hemorrhage: Hypoechoic area posterior to the sac consistent with small to moderate subchorionic hemorrhage. Maternal uterus/adnexae: Probable corpus luteal cyst in the right ovary. Left ovary within normal limits. No significant free fluid. IMPRESSION: 1. Single viable intrauterine pregnancy as above 2. Small to moderate subchorionic hemorrhage. Electronically Signed   By: Jasmine PangKim  Fujinaga M.D.   On: 01/09/2017 23:08    ED COURSE  Nursing notes and initial vitals signs, including pulse oximetry, reviewed.  Vitals:   01/09/17 2029 01/09/17 2031 01/09/17 2305  BP: (!) 143/83  126/81  Pulse: 73  75  Resp: 18  16  Temp: 98.2 F (36.8 C)    TempSrc: Oral    SpO2: 100%  100%  Weight: 92.5 kg (204 lb)    Height:  5\' 9"  (1.753 m)    12:42 AM Patient advised of ultrasound finding showing intrauterine pregnancy.  Clinically her findings are suspicious for a yeast infection and she was advised that the over-the-counter 7-day Monistat is the preferred treatment regimen for yeast infections in pregnancy.  PROCEDURES    ED DIAGNOSES     ICD-10-CM   1. Abdominal pain during intrauterine pregnancy O99.89    R10.9        Sherlon Nied, MD 01/10/17 [phone]

## 2017-01-09 NOTE — ED Notes (Signed)
Patient transported to Ultrasound 

## 2017-01-09 NOTE — ED Notes (Signed)
Called pt to draw blood  No answer

## 2017-01-09 NOTE — ED Triage Notes (Addendum)
Onset last Saturday dizziness,  On Monday started started having sharp left side abd pain  Nausea  Denies vomiting  And also vaginal order  Pt states has gained 12 lbs in less than 1 week

## 2017-01-10 MED ORDER — PRENATAL VITAMIN 27-0.8 MG PO TABS: 1.0000 | ORAL_TABLET | Freq: Every day | ORAL | 0 refills | Status: AC

## 2017-01-10 NOTE — ED Notes (Signed)
ED Provider at bedside. 

## 2017-01-11 LAB — HIV ANTIBODY (ROUTINE TESTING W REFLEX): HIV SCREEN 4TH GENERATION: NONREACTIVE

## 2017-01-13 LAB — GC/CHLAMYDIA PROBE AMP (~~LOC~~) NOT AT ARMC
CHLAMYDIA, DNA PROBE: NEGATIVE
Neisseria Gonorrhea: NEGATIVE

## 2018-08-14 IMAGING — US US OB COMP LESS 14 WK
1 series · 14 of 14 positions shown · non-contrast
Comparison: None.

CLINICAL DATA: Pelvic pain for 6 days

EXAM:
OBSTETRIC <14 WK ULTRASOUND
TECHNIQUE: Transabdominal ultrasound was performed for evaluation of the
gestation as well as the maternal uterus and adnexal regions.

[Series 1: us ob comp less 14 wk · 0.15mm/px · 14 of 14 slices shown]
[im 1/14]
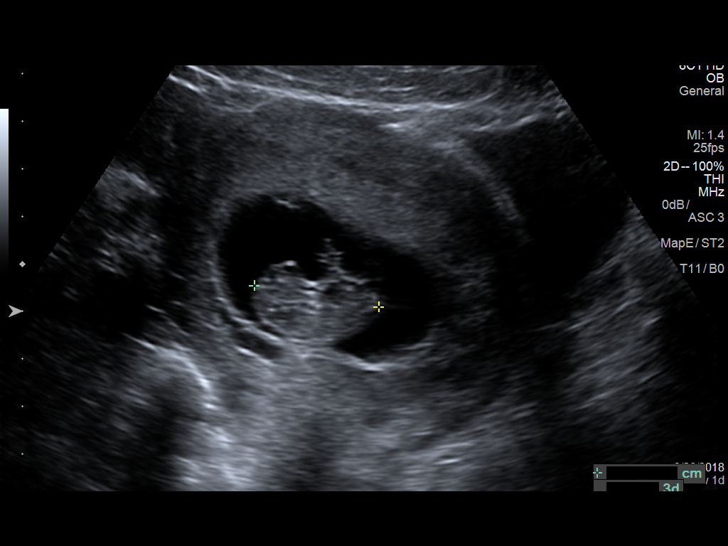
[im 2/14]
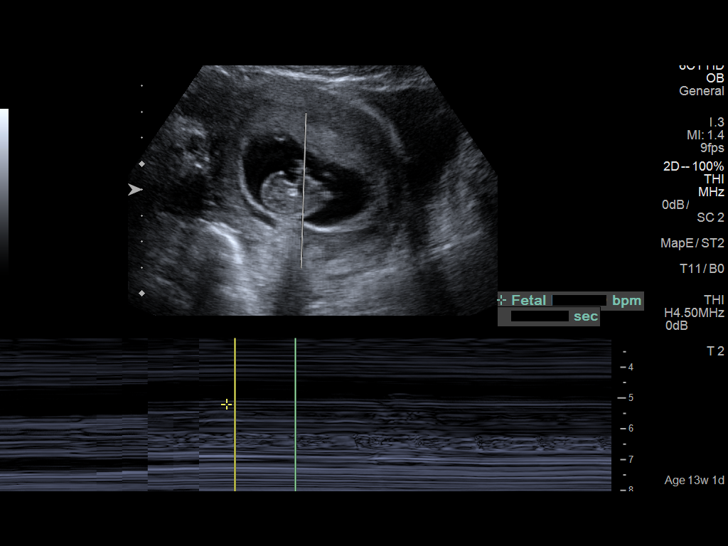
[im 3/14]
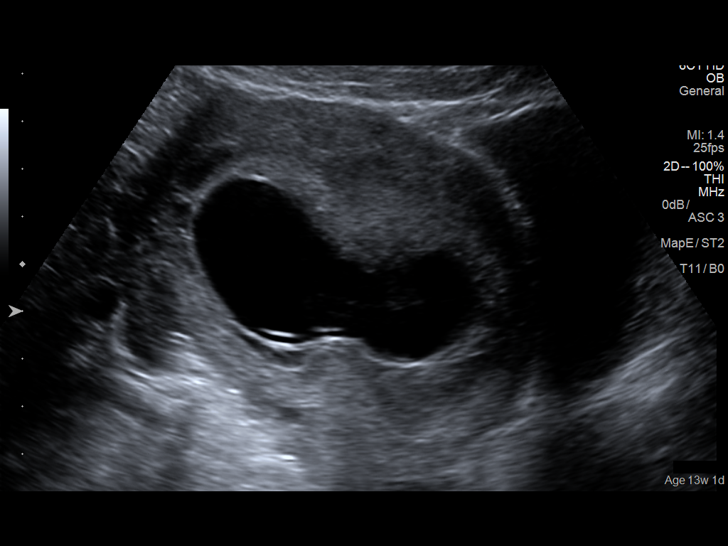
[im 4/14]
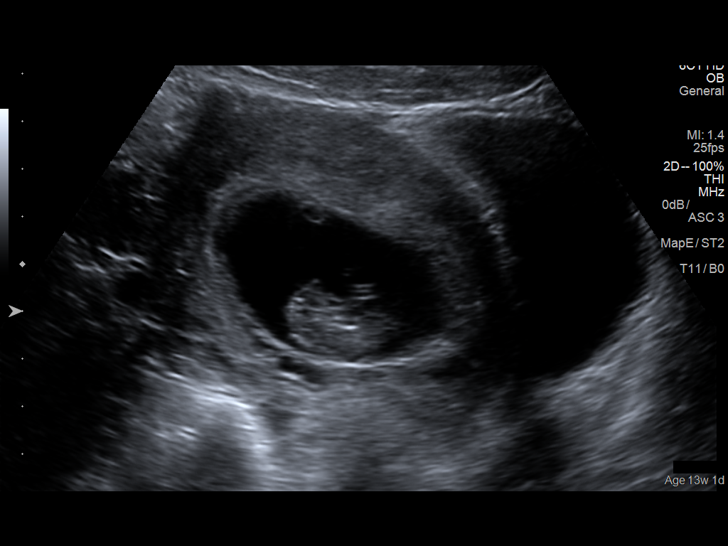
[im 5/14]
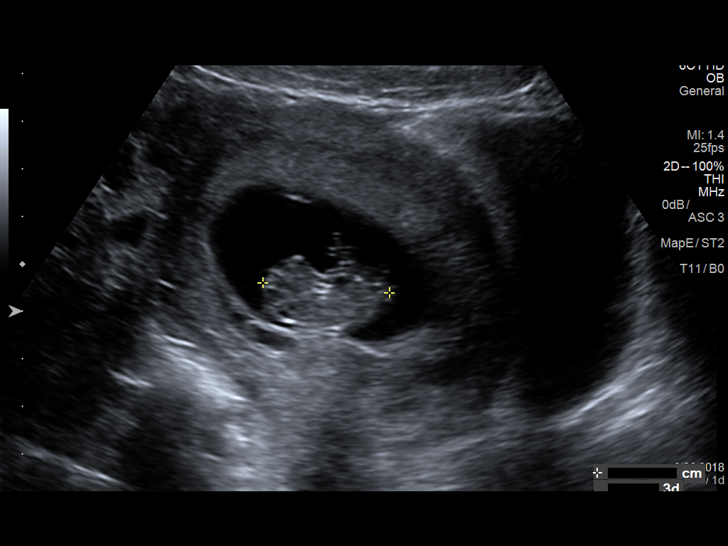
[im 6/14]
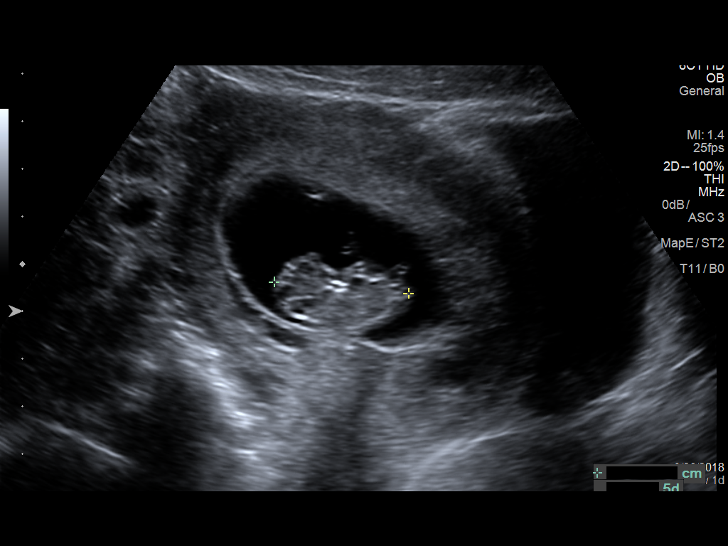
[im 7/14]
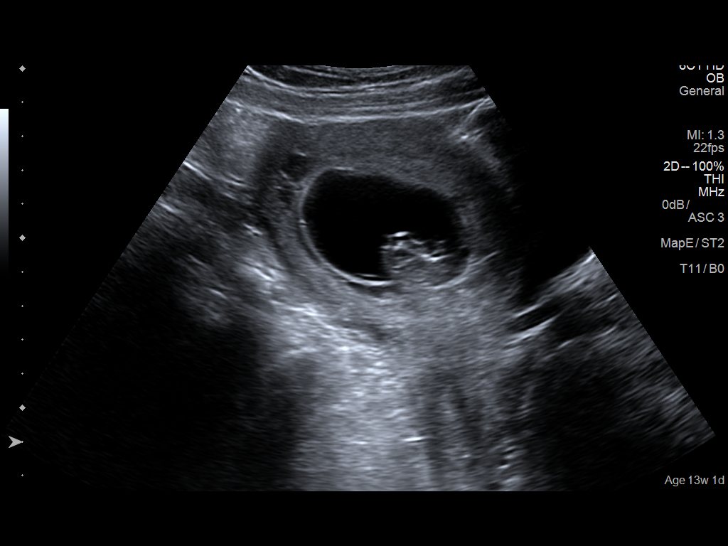
[im 8/14]
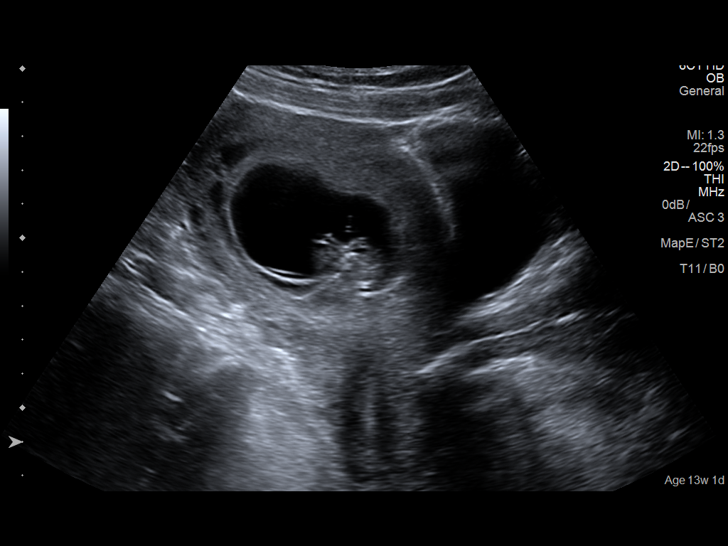
[im 9/14]
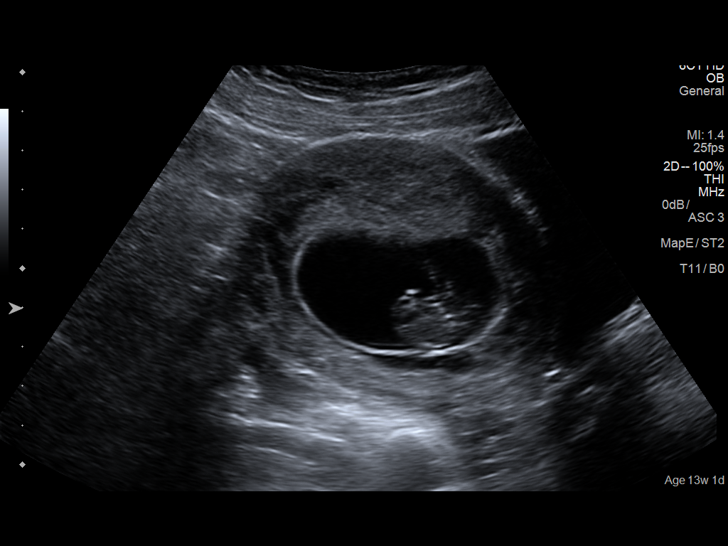
[im 10/14]
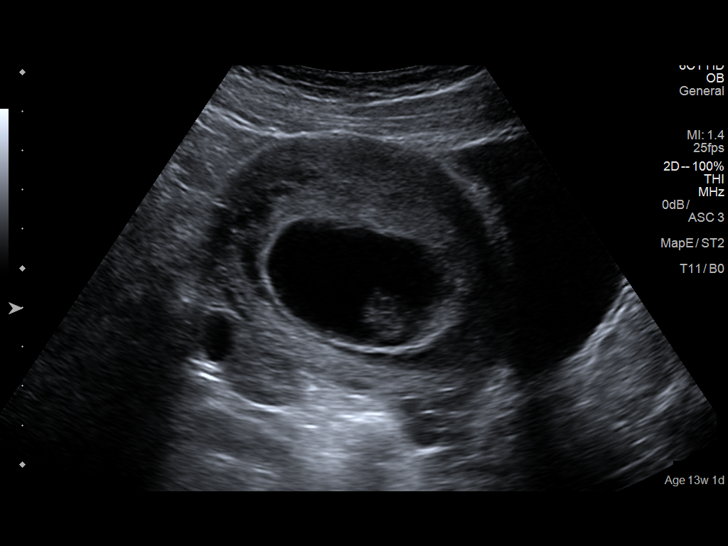
[im 11/14]
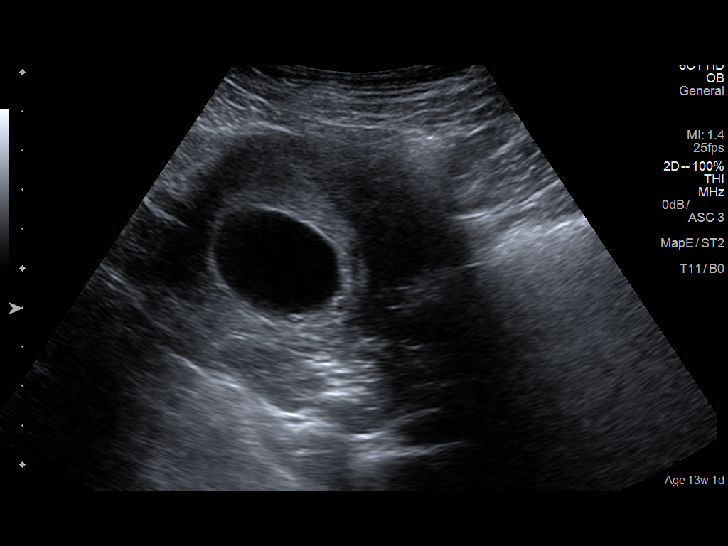
[im 12/14]
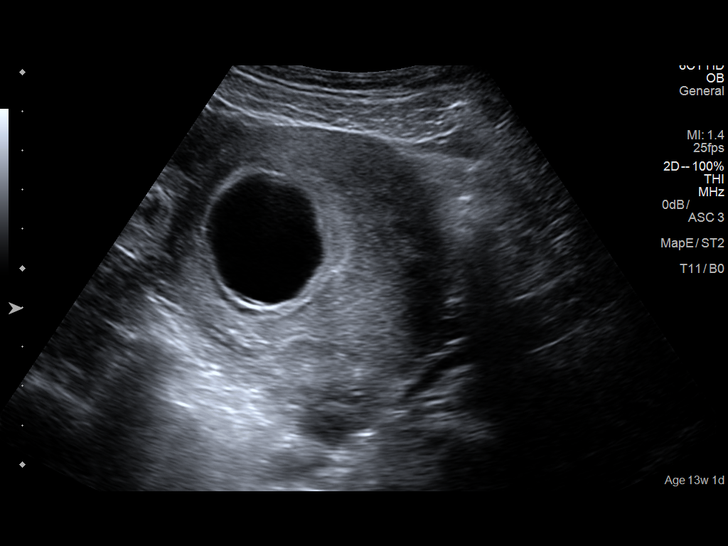
[im 13/14]
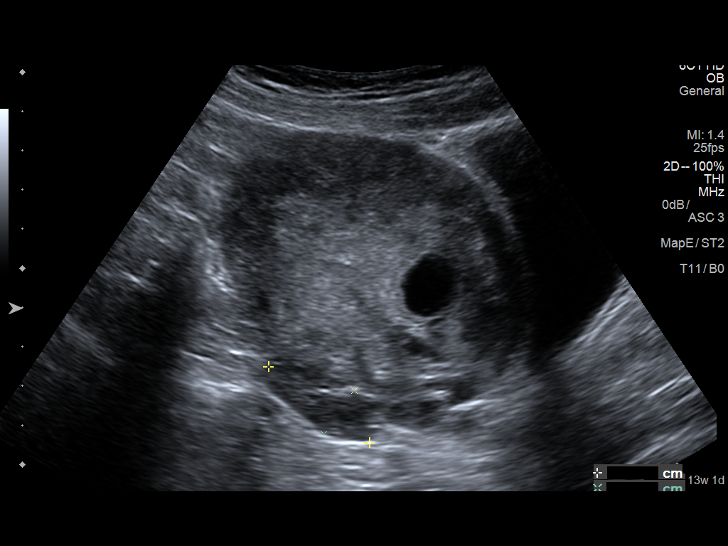
[im 14/14]
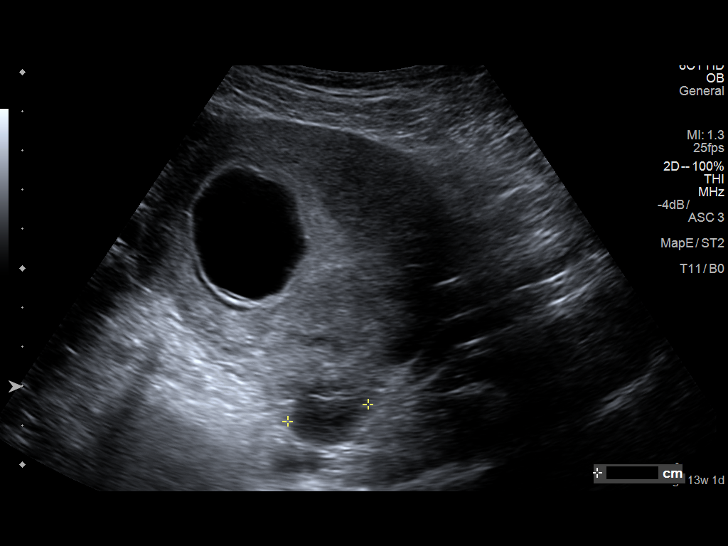

[14 of 14 positions shown; findings below may reference images not displayed]

FINDINGS: Intrauterine gestational sac: Single intrauterine gestation

Yolk sac:  Visible

Embryo:  Visible

Cardiac Activity: Visible

Heart Rate: 169 bpm

CRL:   27.2  mm   9 w 4d                  US EDC: 08/10/2017

Subchorionic hemorrhage: Hypoechoic area posterior to the sac
consistent with small to moderate subchorionic hemorrhage.

Maternal uterus/adnexae: Probable corpus luteal cyst in the right
ovary. Left ovary within normal limits. No significant free fluid.
IMPRESSION: 1. Single viable intrauterine pregnancy as above
2. Small to moderate subchorionic hemorrhage.

## 2023-11-26 ENCOUNTER — Other Ambulatory Visit: Payer: Self-pay

## 2023-11-26 ENCOUNTER — Encounter (HOSPITAL_BASED_OUTPATIENT_CLINIC_OR_DEPARTMENT_OTHER): Payer: Self-pay

## 2023-11-26 DIAGNOSIS — W504XXA Accidental scratch by another person, initial encounter: Secondary | ICD-10-CM | POA: Insufficient documentation

## 2023-11-26 DIAGNOSIS — Y990 Civilian activity done for income or pay: Secondary | ICD-10-CM | POA: Insufficient documentation

## 2023-11-26 DIAGNOSIS — L409 Psoriasis, unspecified: Secondary | ICD-10-CM | POA: Insufficient documentation

## 2023-11-26 DIAGNOSIS — S50812A Abrasion of left forearm, initial encounter: Secondary | ICD-10-CM | POA: Insufficient documentation

## 2023-11-26 NOTE — ED Triage Notes (Signed)
 Pt states a resident scratched her left forearm. Not bleeding She has cleaned area and put antibiotic cream on area

## 2023-11-27 ENCOUNTER — Emergency Department (HOSPITAL_BASED_OUTPATIENT_CLINIC_OR_DEPARTMENT_OTHER)
Admission: EM | Admit: 2023-11-27 | Discharge: 2023-11-27 | Disposition: A | Discharge status: Home / Self Care | Payer: Self-pay | Attending: Emergency Medicine | Admitting: Emergency Medicine

## 2023-11-27 DIAGNOSIS — L409 Psoriasis, unspecified: Secondary | ICD-10-CM

## 2023-11-27 DIAGNOSIS — T148XXA Other injury of unspecified body region, initial encounter: Secondary | ICD-10-CM

## 2023-11-27 MED ORDER — TRIAMCINOLONE ACETONIDE 0.025 % EX OINT: 1.0000 | TOPICAL_OINTMENT | Freq: Two times a day (BID) | CUTANEOUS | 0 refills | Status: AC

## 2023-11-27 MED ORDER — TETANUS-DIPHTH-ACELL PERTUSSIS 5-2-15.5 LF-MCG/0.5 IM SUSP
0.5000 mL | Freq: Once | INTRAMUSCULAR | Status: AC
Start: 2023-11-27 — End: 2023-11-27
  Administered 2023-11-27: 0.5 mL via INTRAMUSCULAR
  Filled 2023-11-27: qty 0.5

## 2023-11-27 NOTE — ED Provider Notes (Signed)
 [place] [facility] Provider Note   CSN: [id number] Arrival date & time: 11/26/23  2347     Patient presents with: arm scratches   Ebony Haney is a 38 y.o. female.   Patient presents to the emergency department for evaluation of skin problems.  She is here primarily because a resident scratched her left forearm at work earlier today.  She cleaned the area with soap and water and put antibiotic cream on it.  She does not think her tetanus is up-to-date.  She also reports that she has a history of psoriasis.  She has particularly bad flares on both of her shins and between her breasts.  She currently does not have a doctor, no treatment regimen.       Prior to Admission medications   Medication Sig Start Date End Date Taking? Authorizing Provider  triamcinolone (KENALOG) 0.025 % ointment Apply 1 Application topically 2 (two) times daily. Apply to psoriasis rash 11/27/23  Yes Kamoria Lucien, Lonni PARAS, MD  Prenatal Vit-Fe Fumarate-FA (PRENATAL VITAMIN) 27-0.8 MG TABS Take 1 tablet by mouth daily. 01/10/17   Molpus, Norleen, MD    Allergies: Patient has no known allergies.    Review of Systems  Updated Vital Signs BP 136/82 (BP Location: Right Arm)   Pulse 78   Temp 97.8 F (36.6 C) (Temporal)   Resp 18   Ht 5' 9 (1.753 m)   Wt 83.9 kg   LMP 11/03/2023 (Exact Date)   SpO2 100%   BMI 27.32 kg/m   Physical Exam Vitals and nursing note reviewed.  Constitutional:      General: She is not in acute distress.    Appearance: She is well-developed.  HENT:     Head: Normocephalic and atraumatic.     Mouth/Throat:     Mouth: Mucous membranes are moist.  Eyes:     General: Vision grossly intact. Gaze aligned appropriately.     Extraocular Movements: Extraocular movements intact.     Conjunctiva/sclera: Conjunctivae normal.  Cardiovascular:     Rate and Rhythm: Normal rate and regular rhythm.     Pulses: Normal pulses.     Heart sounds:  Normal heart sounds, S1 normal and S2 normal. No murmur heard.    No friction rub. No gallop.  Pulmonary:     Effort: Pulmonary effort is normal. No respiratory distress.     Breath sounds: Normal breath sounds.  Abdominal:     General: Bowel sounds are normal.     Palpations: Abdomen is soft.     Tenderness: There is no abdominal tenderness. There is no guarding or rebound.     Hernia: No hernia is present.  Musculoskeletal:        General: No swelling.     Cervical back: Full passive range of motion without pain, normal range of motion and neck supple. No spinous process tenderness or muscular tenderness. Normal range of motion.     Right lower leg: No edema.     Left lower leg: No edema.  Skin:    General: Skin is warm and dry.     Capillary Refill: Capillary refill takes less than 2 seconds.     Findings: Abrasion (Left forearm) and rash (patchy, raised thickened skin bilateral shins) present. No ecchymosis, erythema or wound.  Neurological:     General: No focal deficit present.     Mental Status: She is alert and oriented to person, place, and time.  GCS: GCS eye subscore is 4. GCS verbal subscore is 5. GCS motor subscore is 6.     Cranial Nerves: Cranial nerves 2-12 are intact.     Sensory: Sensation is intact.     Motor: Motor function is intact.     Coordination: Coordination is intact.  Psychiatric:        Attention and Perception: Attention normal.        Mood and Affect: Mood normal.        Speech: Speech normal.        Behavior: Behavior normal.     (all labs ordered are listed, but only abnormal results are displayed) Labs Reviewed - No data to display  EKG: None  Radiology: No results found.   Procedures   Medications Ordered in the ED  Tdap (ADACEL) injection 0.5 mL (has no administration in time range)                                    Medical Decision Making  Presents with abrasions on the left forearm that she suffered at work.  She  appropriately cleaned prior to coming to the ED.  Will update tetanus.  Given wound care instructions including antibiotic ointment.  Patient also complaining of her chronic psoriasis.  She has significant flares on bilateral shins and she also describes similar between her breasts.  Discussed skin care regimens, moisturizers and will prescribe topical steroid.     Final diagnoses:  Abrasion of skin  Psoriasis    ED Discharge Orders          Ordered    triamcinolone (KENALOG) 0.025 % ointment  2 times daily        11/27/23 0048               Haze Lonni PARAS, MD 11/27/23 [phone]
